# Patient Record
Sex: Female | Born: 1980 | Race: White | Hispanic: No | Marital: Single | Smoking: Former smoker
Health system: Southern US, Community
[De-identification: ages and names within clinical notes are randomized; demographics above are authoritative.]

## PROBLEM LIST (undated history)

## (undated) DIAGNOSIS — Z8619 Personal history of other infectious and parasitic diseases: Secondary | ICD-10-CM

## (undated) HISTORY — PX: TUBAL LIGATION: SHX77

## (undated) HISTORY — PX: AUGMENTATION MAMMAPLASTY: SUR837

## (undated) HISTORY — PX: IUD REMOVAL: SHX5392

---
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
## 2005-11-28 ENCOUNTER — Emergency Department: Payer: Self-pay | Admitting: Unknown Physician Specialty

## 2005-12-05 ENCOUNTER — Ambulatory Visit: Payer: Self-pay | Admitting: Family Medicine

## 2006-03-02 ENCOUNTER — Emergency Department: Payer: Self-pay | Admitting: Emergency Medicine

## 2007-07-12 ENCOUNTER — Observation Stay: Payer: Self-pay

## 2007-07-21 ENCOUNTER — Inpatient Hospital Stay: Payer: Self-pay | Admitting: Internal Medicine

## 2007-07-21 ENCOUNTER — Other Ambulatory Visit: Payer: Self-pay

## 2007-08-08 ENCOUNTER — Emergency Department: Payer: Self-pay | Admitting: Unknown Physician Specialty

## 2007-08-19 ENCOUNTER — Inpatient Hospital Stay: Payer: Self-pay | Admitting: Obstetrics and Gynecology

## 2008-08-06 IMAGING — CR DG CHEST 2V
1 series · 2 of 2 positions shown · non-contrast
Comparison: none

REASON FOR EXAM: follow up possible pneumonia and lymphadenopathy
COMMENTS:

[Series 1: view not recorded · 0.17mm/px · 2 of 2 slices shown]
[im 1/2]
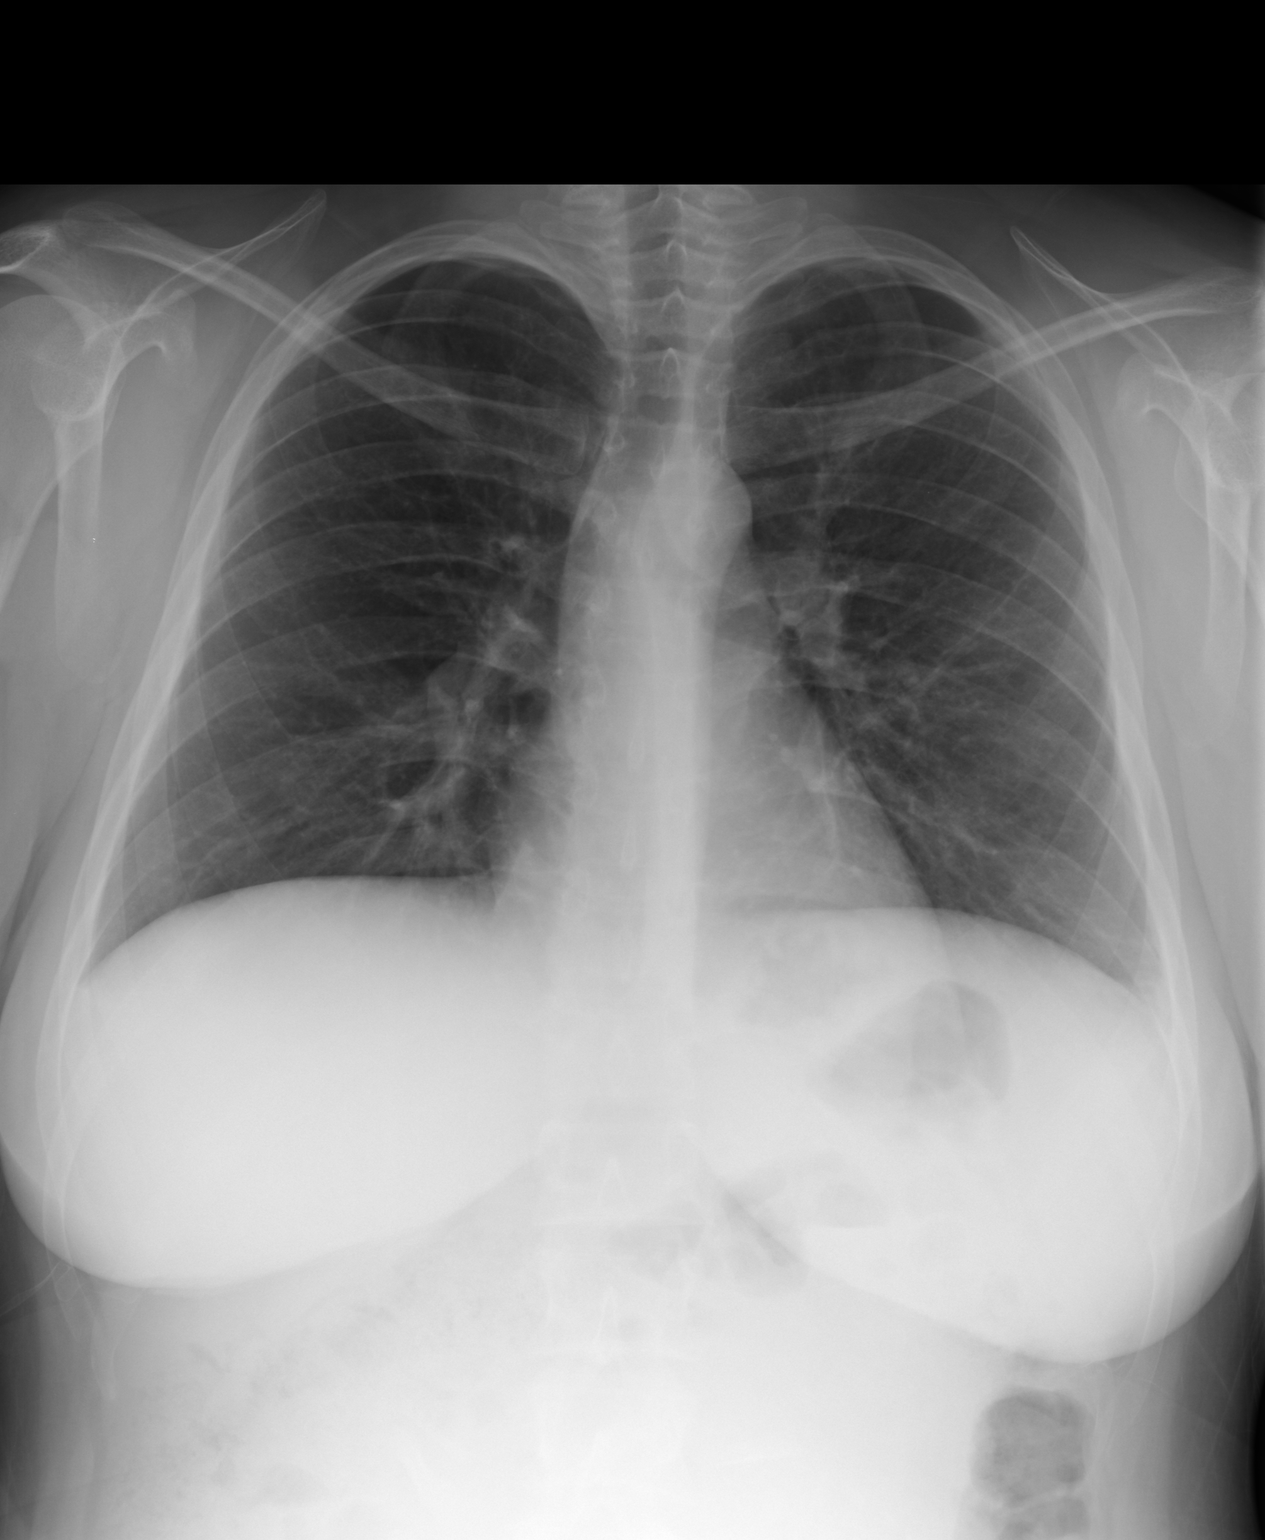
[im 2/2]
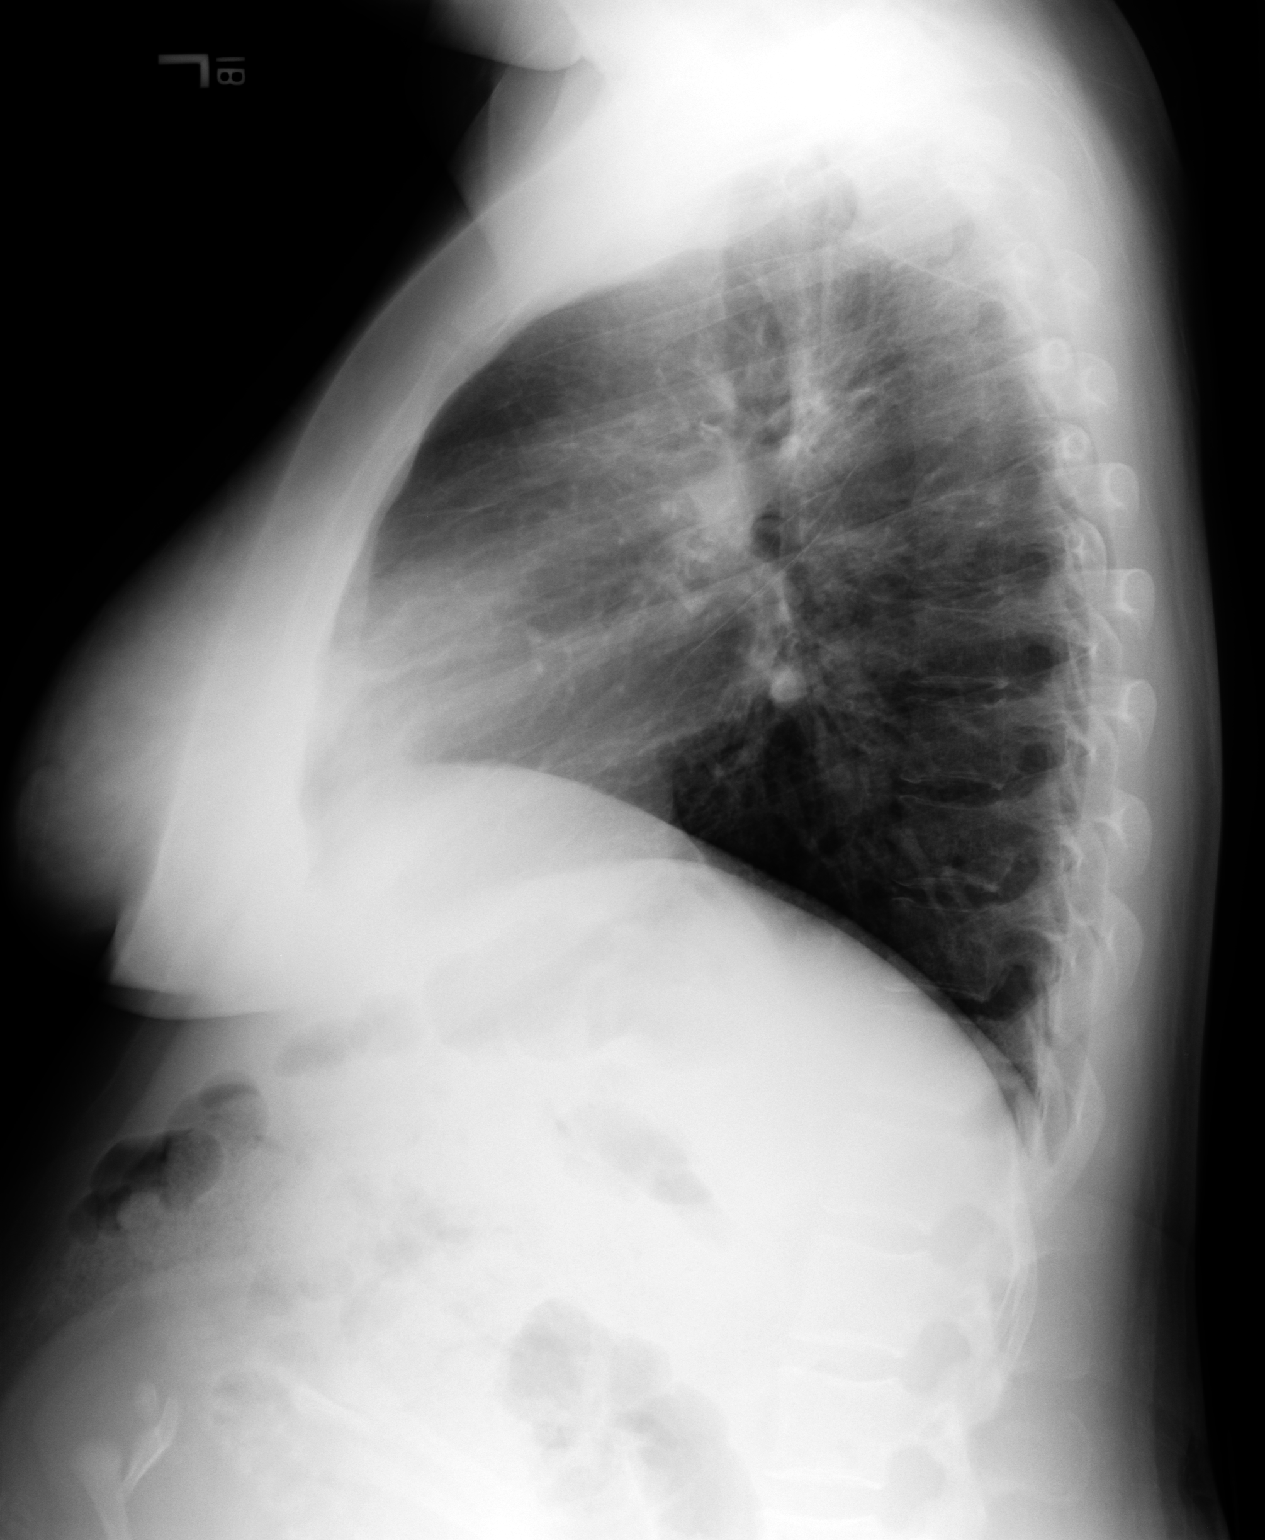

[2 of 2 positions shown; findings below may reference images not displayed]

PROCEDURE:     DXR - DXR CHEST PA (OR AP) AND LATERAL  - July 24, 2007  [DATE]

RESULT:     Comparison is made to the image of 07/21/2007. There is improved
aeration in the RIGHT infrahilar region with persistent mild prominence of
the lung markings. No focal consolidation is seen. There is no effusion or
pneumothorax. The heart is not enlarged. There is no pleural effusion.
IMPRESSION: Some residual underlying bronchitis or bronchiolitis cannot
be completely excluded. There is no progression; in fact, there may be
improved aeration at the RIGHT lung base.

## 2009-04-11 ENCOUNTER — Emergency Department: Payer: Self-pay | Admitting: Emergency Medicine

## 2009-11-21 ENCOUNTER — Emergency Department: Payer: Self-pay | Admitting: Emergency Medicine

## 2010-02-23 ENCOUNTER — Ambulatory Visit: Payer: Self-pay

## 2010-03-02 ENCOUNTER — Ambulatory Visit: Payer: Self-pay

## 2011-08-25 ENCOUNTER — Observation Stay: Payer: Self-pay | Admitting: Obstetrics & Gynecology

## 2011-08-28 LAB — PATHOLOGY REPORT

## 2014-10-12 ENCOUNTER — Emergency Department: Payer: Self-pay | Admitting: Emergency Medicine

## 2014-12-06 ENCOUNTER — Emergency Department: Payer: Self-pay | Admitting: Emergency Medicine

## 2016-08-14 ENCOUNTER — Encounter: Payer: Self-pay | Admitting: Emergency Medicine

## 2016-08-14 ENCOUNTER — Emergency Department
Admission: EM | Admit: 2016-08-14 | Discharge: 2016-08-14 | Disposition: A | Discharge status: Home / Self Care | Payer: Managed Care, Other (non HMO) | Attending: Emergency Medicine | Admitting: Emergency Medicine

## 2016-08-14 DIAGNOSIS — R112 Nausea with vomiting, unspecified: Secondary | ICD-10-CM | POA: Diagnosis not present

## 2016-08-14 DIAGNOSIS — R197 Diarrhea, unspecified: Secondary | ICD-10-CM | POA: Diagnosis not present

## 2016-08-14 DIAGNOSIS — R1032 Left lower quadrant pain: Secondary | ICD-10-CM

## 2016-08-14 DIAGNOSIS — J069 Acute upper respiratory infection, unspecified: Secondary | ICD-10-CM | POA: Diagnosis not present

## 2016-08-14 LAB — CBC
HEMATOCRIT: 41.2 % (ref 35.0–47.0)
Hemoglobin: 14.6 g/dL (ref 12.0–16.0)
MCH: 33.2 pg (ref 26.0–34.0)
MCHC: 35.5 g/dL (ref 32.0–36.0)
MCV: 93.5 fL (ref 80.0–100.0)
PLATELETS: 164 10*3/uL (ref 150–440)
RBC: 4.41 MIL/uL (ref 3.80–5.20)
RDW: 13.1 % (ref 11.5–14.5)
WBC: 6.1 10*3/uL (ref 3.6–11.0)

## 2016-08-14 LAB — COMPREHENSIVE METABOLIC PANEL
ALT: 13 U/L — AB (ref 14–54)
AST: 16 U/L (ref 15–41)
Albumin: 4.4 g/dL (ref 3.5–5.0)
Alkaline Phosphatase: 49 U/L (ref 38–126)
Anion gap: 6 (ref 5–15)
BUN: 8 mg/dL (ref 6–20)
CHLORIDE: 105 mmol/L (ref 101–111)
CO2: 26 mmol/L (ref 22–32)
CREATININE: 0.58 mg/dL (ref 0.44–1.00)
Calcium: 9.3 mg/dL (ref 8.9–10.3)
Glucose, Bld: 105 mg/dL — ABNORMAL HIGH (ref 65–99)
POTASSIUM: 3.6 mmol/L (ref 3.5–5.1)
SODIUM: 137 mmol/L (ref 135–145)
Total Bilirubin: 0.9 mg/dL (ref 0.3–1.2)
Total Protein: 7.7 g/dL (ref 6.5–8.1)

## 2016-08-14 LAB — URINALYSIS COMPLETE WITH MICROSCOPIC (ARMC ONLY)
BACTERIA UA: NONE SEEN
Bilirubin Urine: NEGATIVE
Glucose, UA: NEGATIVE mg/dL
HGB URINE DIPSTICK: NEGATIVE
Ketones, ur: NEGATIVE mg/dL
Nitrite: NEGATIVE
PH: 6 (ref 5.0–8.0)
PROTEIN: NEGATIVE mg/dL
SPECIFIC GRAVITY, URINE: 1.006 (ref 1.005–1.030)

## 2016-08-14 LAB — PREGNANCY, URINE: PREG TEST UR: NEGATIVE

## 2016-08-14 LAB — LIPASE, BLOOD: LIPASE: 21 U/L (ref 11–51)

## 2016-08-14 MED ORDER — NITROFURANTOIN MONOHYD MACRO 100 MG PO CAPS
100.0000 mg | ORAL_CAPSULE | Freq: Once | ORAL | Status: AC
  Administered 2016-08-14: 100 mg via ORAL
  Filled 2016-08-14: qty 1

## 2016-08-14 MED ORDER — ONDANSETRON HCL 4 MG PO TABS
4.0000 mg | ORAL_TABLET | Freq: Once | ORAL | Status: AC
  Administered 2016-08-14: 4 mg via ORAL
  Filled 2016-08-14 (×2): qty 1

## 2016-08-14 MED ORDER — ONDANSETRON HCL 4 MG PO TABS: 4.0000 mg | ORAL_TABLET | Freq: Every day | ORAL | 0 refills | Status: DC | PRN

## 2016-08-14 MED ORDER — NITROFURANTOIN MONOHYD MACRO 100 MG PO CAPS
ORAL_CAPSULE | ORAL | Status: AC
  Administered 2016-08-14: 100 mg via ORAL
  Filled 2016-08-14: qty 1

## 2016-08-14 MED ORDER — DICYCLOMINE HCL 10 MG PO CAPS
10.0000 mg | ORAL_CAPSULE | Freq: Once | ORAL | Status: AC
  Administered 2016-08-14: 10 mg via ORAL
  Filled 2016-08-14 (×2): qty 1

## 2016-08-14 MED ORDER — DICYCLOMINE HCL 20 MG PO TABS: 20.0000 mg | ORAL_TABLET | Freq: Three times a day (TID) | ORAL | 0 refills | Status: DC | PRN

## 2016-08-14 MED ORDER — NITROFURANTOIN MONOHYD MACRO 100 MG PO CAPS: 100.0000 mg | ORAL_CAPSULE | Freq: Two times a day (BID) | ORAL | 0 refills | Status: AC

## 2016-08-14 NOTE — ED Triage Notes (Signed)
Patient ambulatory to triage with steady gait, without difficulty or distress noted; pt reports since last Tuesday having N/V, chills, and lower abd pain

## 2016-08-14 NOTE — ED Provider Notes (Signed)
[facility] Emergency Department Provider Note  ____________________________________________   First MD Initiated Contact with Patient 08/14/16 2112     (approximate)  I have reviewed the triage vital signs and the nursing notes.   HISTORY  Chief Complaint Abdominal Pain   HPI Karen Lowery is a 35 y.o. female without any chronic medical problems was presenting to the emergency department todaywith 1 day of lower abdominal cramping which she describes as a 5 out of 10. She says that she has felt sick since last week when she had a runny nose, cough and sore throat. She says the sore throat is been resolving. She denies any known sick contacts. Says since yesterday she has had nausea, vomiting and diarrhea. Says she has vomited about 5 times and had 2 episodes of diarrhea. Denies any blood in her vomitus or her stool. She says that she is most concerned about her nausea and vomiting. Says that her abdominal pain is around the bellybutton. Denies any radiation.   History reviewed. No pertinent past medical history.  There are no active problems to display for this patient.   History reviewed. No pertinent surgical history.  Prior to Admission medications   Not on File    Allergies Patient has no known allergies.  No family history on file.  Social History Social History  Substance Use Topics  . Smoking status: Never Smoker  . Smokeless tobacco: Never Used  . Alcohol use No    Review of Systems Constitutional: No fever/chills Eyes: No visual changes. ENT: No sore throat. Cardiovascular: Denies chest pain. Respiratory: Denies shortness of breath. Gastrointestinal: No constipation. Genitourinary: Negative for dysuria. Musculoskeletal: Negative for back pain. Skin: Negative for rash. Neurological: Negative for headaches, focal weakness or numbness.  10-point ROS otherwise  negative.  ____________________________________________   PHYSICAL EXAM:  VITAL SIGNS: ED Triage Vitals [08/14/16 2009]  Enc Vitals Group     BP 128/86     Pulse Rate 98     Resp 18     Temp 98.4 F (36.9 C)     Temp Source Oral     SpO2 100 %     Weight 130 lb (59 kg)     Height 5\' 2"  (1.575 m)     Head Circumference      Peak Flow      Pain Score 5     Pain Loc      Pain Edu?      Excl. in [place]?     Constitutional: Alert and oriented. Well appearing and in no acute distress. Eyes: Conjunctivae are normal. PERRL. EOMI. Head: Atraumatic. Nose: No congestion/rhinnorhea. Mouth/Throat: Mucous membranes are moist.   Neck: No stridor.   Cardiovascular: Normal rate, regular rhythm. Grossly normal heart sounds.   Respiratory: Normal respiratory effort.  No retractions. Lungs CTAB. Gastrointestinal: Soft With mild left lower quadrant tenderness. No distention. No CVA tenderness palpation. Musculoskeletal: No lower extremity tenderness nor edema.  No joint effusions. Neurologic:  Normal speech and language. No gross focal neurologic deficits are appreciated.  Skin:  Skin is warm, dry and intact. No rash noted. Psychiatric: Mood and affect are normal. Speech and behavior are normal.  ____________________________________________   LABS (all labs ordered are listed, but only abnormal results are displayed)  Labs Reviewed  COMPREHENSIVE METABOLIC PANEL - Abnormal; Notable for the following:       Result Value   Glucose, Bld 105 (*)    ALT 13 (*)  All other components within normal limits  URINALYSIS COMPLETEWITH MICROSCOPIC ([facility] ONLY) - Abnormal; Notable for the following:    Color, Urine YELLOW (*)    APPearance CLEAR (*)    Leukocytes, UA 3+ (*)    Squamous Epithelial / LPF 0-5 (*)    All other components within normal limits  URINE CULTURE  LIPASE, BLOOD  CBC  PREGNANCY, URINE    ____________________________________________  EKG   ____________________________________________  RADIOLOGY   ____________________________________________   PROCEDURES  Procedure(s) performed:   Procedures  Critical Care performed:   ____________________________________________   INITIAL IMPRESSION / ASSESSMENT AND PLAN / ED COURSE  Pertinent labs & imaging results that were available during my care of the patient were reviewed by me and considered in my medical decision making (see chart for details).  Clinical Course   ----------------------------------------- 10:45 PM on 08/14/2016 -----------------------------------------  After medication the patient is able to take sips of water. Abdominal exam is unlikely be an acute process requiring hospitalization or surgical intervention at this time. Her story is consistent with a viral syndrome. She has very reassuring labs except for UTI which we will be treating. She'll be discharged home on Zofran as well as Bentyl. Additionally, she'll be given antibiotics for the UTI. She is to return for any worsening or concerning symptoms.   ____________________________________________   FINAL CLINICAL IMPRESSION(S) / ED DIAGNOSES  URI. Abdominal pain. Nausea vomiting and diarrhea.    NEW MEDICATIONS STARTED DURING THIS VISIT:  New Prescriptions   No medications on file     Note:  This document was prepared using Dragon voice recognition software and may include unintentional dictation errors.    Orbie Pyo, MD 08/14/16 [phone]

## 2016-08-14 NOTE — ED Notes (Signed)
Pt. Reports cold sx x8 days, N/V/D x1 day. No OTC meds at home

## 2016-08-14 NOTE — ED Notes (Signed)
Pt unable to urinate at this moment, given specimen cup for when is able to void.  

## 2016-08-14 NOTE — ED Notes (Signed)

## 2016-08-16 LAB — URINE CULTURE: Culture: <10000 — AB

## 2016-08-19 ENCOUNTER — Emergency Department
Admission: EM | Admit: 2016-08-19 | Discharge: 2016-08-19 | Disposition: A | Discharge status: Home / Self Care | Payer: Managed Care, Other (non HMO) | Attending: Emergency Medicine | Admitting: Emergency Medicine

## 2016-08-19 ENCOUNTER — Emergency Department: Payer: Managed Care, Other (non HMO)

## 2016-08-19 ENCOUNTER — Encounter: Payer: Self-pay | Admitting: Emergency Medicine

## 2016-08-19 DIAGNOSIS — R109 Unspecified abdominal pain: Secondary | ICD-10-CM

## 2016-08-19 DIAGNOSIS — R1012 Left upper quadrant pain: Secondary | ICD-10-CM | POA: Insufficient documentation

## 2016-08-19 LAB — CBC
HCT: 39.3 % (ref 35.0–47.0)
HEMOGLOBIN: 13.9 g/dL (ref 12.0–16.0)
MCH: 33.5 pg (ref 26.0–34.0)
MCHC: 35.3 g/dL (ref 32.0–36.0)
MCV: 94.7 fL (ref 80.0–100.0)
Platelets: 164 10*3/uL (ref 150–440)
RBC: 4.16 MIL/uL (ref 3.80–5.20)
RDW: 12.8 % (ref 11.5–14.5)
WBC: 6.4 10*3/uL (ref 3.6–11.0)

## 2016-08-19 LAB — COMPREHENSIVE METABOLIC PANEL
ALK PHOS: 57 U/L (ref 38–126)
ALT: 9 U/L — ABNORMAL LOW (ref 14–54)
ANION GAP: 6 (ref 5–15)
AST: 16 U/L (ref 15–41)
Albumin: 4 g/dL (ref 3.5–5.0)
BILIRUBIN TOTAL: 0.4 mg/dL (ref 0.3–1.2)
BUN: 8 mg/dL (ref 6–20)
CALCIUM: 8.9 mg/dL (ref 8.9–10.3)
CO2: 27 mmol/L (ref 22–32)
Chloride: 105 mmol/L (ref 101–111)
Creatinine, Ser: 0.68 mg/dL (ref 0.44–1.00)
GFR calc non Af Amer: >60 mL/min (ref >60)
Glucose, Bld: 85 mg/dL (ref 65–99)
Potassium: 3.7 mmol/L (ref 3.5–5.1)
Sodium: 138 mmol/L (ref 135–145)
TOTAL PROTEIN: 7.2 g/dL (ref 6.5–8.1)

## 2016-08-19 LAB — URINALYSIS COMPLETE WITH MICROSCOPIC (ARMC ONLY)
Bacteria, UA: NONE SEEN
Bilirubin Urine: NEGATIVE
Glucose, UA: NEGATIVE mg/dL
HGB URINE DIPSTICK: NEGATIVE
Ketones, ur: NEGATIVE mg/dL
Leukocytes, UA: NEGATIVE
NITRITE: NEGATIVE
PROTEIN: NEGATIVE mg/dL
SPECIFIC GRAVITY, URINE: 1.002 — AB (ref 1.005–1.030)
pH: 8 (ref 5.0–8.0)

## 2016-08-19 LAB — POCT PREGNANCY, URINE: PREG TEST UR: NEGATIVE

## 2016-08-19 LAB — LIPASE, BLOOD: LIPASE: 17 U/L (ref 11–51)

## 2016-08-19 MED ORDER — ONDANSETRON HCL 4 MG/2ML IJ SOLN: 4.0000 mg | INTRAMUSCULAR | Status: DC

## 2016-08-19 NOTE — ED Triage Notes (Signed)
Pt presents to ED c/o abd pain since thanksgiving . Pt was recently seen here and diagnosed with bladder infection and now has pain "right underneath my ribcage"; denies n/v.

## 2016-08-19 NOTE — Discharge Instructions (Signed)
You have been seen in the Emergency Department (ED) for abdominal/flank pain.  Your evaluation did not identify a clear cause of your symptoms but was generally reassuring. ° °Please follow up as instructed above regarding today?s emergent visit and the symptoms that are bothering you. ° °Return to the ED if your abdominal pain worsens or fails to improve, you develop bloody vomiting, bloody diarrhea, you are unable to tolerate fluids due to vomiting, fever greater than 101, or other symptoms that concern you. ° °

## 2016-08-19 NOTE — ED Provider Notes (Signed)
[facility] ([place]) Emergency Department Provider Note  ____________________________________________   First MD Initiated Contact with Patient 08/19/16 1417     (approximate)  I have reviewed the triage vital signs and the nursing notes.   HISTORY  Chief Complaint Abdominal Pain and Flank Pain    HPI Karen Lowery is a 35 y.o. female with no chronic medical problems who presents for evaluation of persistent left sided abdominal/flank pain.  She states that it has been going on for between 1 and 2 weeks.  The pain is intermittent, sharp and stabbing, and located around her back up underneath the left side of the rib cage.  It is not worse with deep breaths.  It is worse with moving around.  She denies chest pain, shortness of breath, right-sided abdominal pain both upper and lower, dysuria, nausea, vomiting, diarrhea, constipation, vaginal bleeding, vaginal discharge, sore throat, nasal congestion.  (She did have some nausea a week ago, but that has resolved.)  When she was seen here about a week ago she had normal labs except for a slightly positive urinalysis and was treated for UTI.  She states that the pain is not any better.  She describes the pain as moderate and she is concerned because it is persisted for this long.  She has no outpatient doctor.  She has no history of kidney stones or of lung problems.   History reviewed. No pertinent past medical history.  There are no active problems to display for this patient.   Past Surgical History:  Procedure Laterality Date  . IUD REMOVAL      Prior to Admission medications   Medication Sig Start Date End Date Taking? Authorizing Provider  dicyclomine (BENTYL) 20 MG tablet Take 1 tablet (20 mg total) by mouth 3 (three) times daily as needed for spasms. 08/14/16 08/14/17  Orbie Pyo, MD  nitrofurantoin, macrocrystal-monohydrate, (MACROBID) 100 MG capsule Take 1 capsule (100 mg total) by mouth 2 (two)  times daily. 08/14/16 08/21/16  Orbie Pyo, MD  ondansetron (ZOFRAN) 4 MG tablet Take 1 tablet (4 mg total) by mouth daily as needed. 08/14/16   Orbie Pyo, MD    Allergies Patient has no known allergies.  History reviewed. No pertinent family history.  Social History Social History  Substance Use Topics  . Smoking status: Never Smoker  . Smokeless tobacco: Never Used  . Alcohol use No    Review of Systems Constitutional: No fever/chills Eyes: No visual changes. ENT: No sore throat. Cardiovascular: Denies chest pain. Respiratory: Denies shortness of breath. Gastrointestinal: Left sided upper posterior abdominal pain/flank pain.  No right-sided abdominal pain.  Nausea about a week ago, now resolved.  No vomiting nor diarrhea Genitourinary: Negative for dysuria. No vaginal bleeding/discharge Musculoskeletal: Negative for back pain. Skin: Negative for rash. Neurological: Negative for headaches, focal weakness or numbness.  10-point ROS otherwise negative.  ____________________________________________   PHYSICAL EXAM:  VITAL SIGNS: ED Triage Vitals  Enc Vitals Group     BP 08/19/16 1317 101/62     Pulse Rate 08/19/16 1317 71     Resp 08/19/16 1317 18     Temp 08/19/16 1317 97.8 F (36.6 C)     Temp Source 08/19/16 1317 Oral     SpO2 08/19/16 1317 100 %     Weight 08/19/16 1318 130 lb (59 kg)     Height 08/19/16 1318 5\' 3"  (1.6 m)     Head Circumference --  Peak Flow --      Pain Score 08/19/16 1318 7     Pain Loc --      Pain Edu? --      Excl. in [place]? --     Constitutional: Alert and oriented. Well appearing and in no acute distress. Eyes: Conjunctivae are normal. PERRL. EOMI. Head: Atraumatic. Nose: No congestion/rhinnorhea. Mouth/Throat: Mucous membranes are moist.  Oropharynx non-erythematous. Neck: No stridor.  No meningeal signs.   Cardiovascular: Normal rate, regular rhythm. Good peripheral circulation. Grossly normal heart  sounds. Respiratory: Normal respiratory effort.  No retractions. Lungs CTAB. Gastrointestinal: Normal body habitus.  Soft and nontender of the anterior abdomen throughout all quadrants. No distention.  Patient does have left sided CVA tenderness. Musculoskeletal: No lower extremity tenderness nor edema. No gross deformities of extremities. Neurologic:  Normal speech and language. No gross focal neurologic deficits are appreciated.  Skin:  Skin is warm, dry and intact. No rash noted. Psychiatric: Mood and affect are normal. Speech and behavior are normal.  ____________________________________________   LABS (all labs ordered are listed, but only abnormal results are displayed)  Labs Reviewed  COMPREHENSIVE METABOLIC PANEL - Abnormal; Notable for the following:       Result Value   ALT 9 (*)    All other components within normal limits  URINALYSIS COMPLETEWITH MICROSCOPIC ([facility] ONLY) - Abnormal; Notable for the following:    Color, Urine YELLOW (*)    APPearance CLEAR (*)    Specific Gravity, Urine 1.002 (*)    Squamous Epithelial / LPF 0-5 (*)    All other components within normal limits  LIPASE, BLOOD  CBC  POC URINE PREG, ED  POCT PREGNANCY, URINE   ____________________________________________  EKG  None - EKG not ordered by ED physician ____________________________________________  RADIOLOGY   Ct Renal Stone Study  Result Date: 08/19/2016 CLINICAL DATA:  Left-sided abdominal and flank pain for 1 week. Urinary tract infection. EXAM: CT ABDOMEN AND PELVIS WITHOUT CONTRAST TECHNIQUE: Multidetector CT imaging of the abdomen and pelvis was performed following the standard protocol without IV contrast. COMPARISON:  08/24/2011 FINDINGS: Lower chest: No acute findings. Hepatobiliary: No masses visualized on this unenhanced exam. Gallbladder is unremarkable. Pancreas: No mass or inflammatory process visualized on this unenhanced exam. Spleen:  Within normal limits in size.  Adrenals/Urinary tract: No evidence of urolithiasis or hydronephrosis. No evidence of perinephric stranding or fluid collections. Unremarkable appearance of bladder. Stomach/Bowel: No evidence of obstruction, inflammatory process, or abnormal fluid collections. Normal appendix visualized. Vascular/Lymphatic: No pathologically enlarged lymph nodes identified. No evidence of abdominal aortic aneurysm. Reproductive:  No mass or other significant abnormality. Other:  None. Musculoskeletal:  No suspicious bone lesions identified. IMPRESSION: No evidence of urolithiasis, hydronephrosis, or other acute findings. Electronically Signed   By: Earle Gell M.D.   On: 08/19/2016 14:50    ____________________________________________   PROCEDURES  Procedure(s) performed:   Procedures   Critical Care performed: No ____________________________________________   INITIAL IMPRESSION / ASSESSMENT AND PLAN / ED COURSE  Pertinent labs & imaging results that were available during my care of the patient were reviewed by me and considered in my medical decision making (see chart for details).  The patient's physical exam, lab results, and vital signs are all reassuring.  She has no tenderness to palpation of her abdomen and she only has reproducible tenderness when palpating and tapping on her left flank.  We had a discussion about the results and her symptoms and we decided upon a CT  renal stone protocol to look for any gross abnormalities as well as ruling out the possibility that she may have kidney stones that are intermittently causing her issues.  I explained to her that if this is normal then I suspect that her pain is likely musculoskeletal and she will need to establish outpatient provider follow-up.  She has no risk factors for ACS, she is PERC negative, has no signs or symptoms of DVT, and there is no reason to suspect a vascular abnormality to the kidney which would require CT angiography.  He agrees with  the plan at this time.   Clinical Course as of Aug 19 1516  Sun Aug 19, 2016  1516 Normal CT.  I updated the patient with the reassuring results and encouraged her to treat this as a musculoskeletal injury and to set up a PCP/outpatient follow-up as soon as possible.I gave my usual and customary return precautions. Given the duration of symptoms I encouraged her to continue with over-the-counter medications such as Tylenol and Aleve. CT Renal Laren Everts [CF]    Clinical Course User Index [CF] Hinda Kehr, MD    ____________________________________________  FINAL CLINICAL IMPRESSION(S) / ED DIAGNOSES  Final diagnoses:  Left flank pain     MEDICATIONS GIVEN DURING THIS VISIT:  Medications - No data to display   NEW OUTPATIENT MEDICATIONS STARTED DURING THIS VISIT:  New Prescriptions   No medications on file    Modified Medications   No medications on file    Discontinued Medications   No medications on file     Note:  This document was prepared using Dragon voice recognition software and may include unintentional dictation errors.    Hinda Kehr, MD 08/19/16 [phone]

## 2016-08-19 NOTE — ED Notes (Signed)
Pt back from CT. In room with lights dimmed.

## 2016-08-19 NOTE — ED Notes (Signed)
Pt alert and oriented X4, active, cooperative, pt in NAD. RR even and unlabored, color WNL.  Pt informed to return if any life threatening symptoms occur.   

## 2016-08-19 NOTE — ED Notes (Addendum)
Left sided abdominal pain X 1 week. Pt being treated for UTI, pain no better. States nausea better than it was before. Pt pushed to room in wheelchair. Pt alert and oriented X4, active, cooperative, pt in NAD. RR even and unlabored, color WNL.

## 2016-08-19 NOTE — ED Notes (Signed)
ED Provider at bedside. 

## 2016-08-19 NOTE — ED Notes (Signed)
Patient transported to CT 

## 2019-05-19 ENCOUNTER — Other Ambulatory Visit: Payer: Self-pay

## 2019-05-19 ENCOUNTER — Encounter: Payer: Self-pay | Admitting: Emergency Medicine

## 2019-05-19 DIAGNOSIS — M791 Myalgia, unspecified site: Secondary | ICD-10-CM | POA: Insufficient documentation

## 2019-05-19 DIAGNOSIS — R51 Headache: Secondary | ICD-10-CM | POA: Insufficient documentation

## 2019-05-19 DIAGNOSIS — U071 COVID-19: Secondary | ICD-10-CM | POA: Insufficient documentation

## 2019-05-19 DIAGNOSIS — R52 Pain, unspecified: Secondary | ICD-10-CM | POA: Diagnosis present

## 2019-05-19 DIAGNOSIS — Z8616 Personal history of COVID-19: Secondary | ICD-10-CM

## 2019-05-19 DIAGNOSIS — R112 Nausea with vomiting, unspecified: Secondary | ICD-10-CM | POA: Diagnosis not present

## 2019-05-19 HISTORY — DX: Personal history of COVID-19: Z86.16

## 2019-05-19 NOTE — ED Triage Notes (Signed)
Pt reports frontal HA accomp by nausea tonight; +COVID on Monday

## 2019-05-19 NOTE — ED Notes (Signed)
No answer when called several times from lobby 

## 2019-05-20 ENCOUNTER — Encounter: Payer: Self-pay | Admitting: Emergency Medicine

## 2019-05-20 ENCOUNTER — Emergency Department
Admission: EM | Admit: 2019-05-20 | Discharge: 2019-05-20 | Disposition: A | Discharge status: Home / Self Care | Payer: Managed Care, Other (non HMO) | Attending: Emergency Medicine | Admitting: Emergency Medicine

## 2019-05-20 DIAGNOSIS — R112 Nausea with vomiting, unspecified: Secondary | ICD-10-CM

## 2019-05-20 DIAGNOSIS — U071 COVID-19: Secondary | ICD-10-CM

## 2019-05-20 DIAGNOSIS — R519 Headache, unspecified: Secondary | ICD-10-CM

## 2019-05-20 DIAGNOSIS — M791 Myalgia, unspecified site: Secondary | ICD-10-CM

## 2019-05-20 LAB — CBC WITH DIFFERENTIAL/PLATELET
Abs Immature Granulocytes: 0 10*3/uL (ref 0.00–0.07)
Basophils Absolute: 0 10*3/uL (ref 0.0–0.1)
Basophils Relative: 0 %
Eosinophils Absolute: 0 10*3/uL (ref 0.0–0.5)
Eosinophils Relative: 0 %
HCT: 52.2 % — ABNORMAL HIGH (ref 36.0–46.0)
Hemoglobin: 17.8 g/dL — ABNORMAL HIGH (ref 12.0–15.0)
Immature Granulocytes: 0 %
Lymphocytes Relative: 48 %
Lymphs Abs: 1.3 10*3/uL (ref 0.7–4.0)
MCH: 31 pg (ref 26.0–34.0)
MCHC: 34.1 g/dL (ref 30.0–36.0)
MCV: 90.8 fL (ref 80.0–100.0)
Monocytes Absolute: 0.2 10*3/uL (ref 0.1–1.0)
Monocytes Relative: 7 %
Neutro Abs: 1.3 10*3/uL — ABNORMAL LOW (ref 1.7–7.7)
Neutrophils Relative %: 45 %
Platelets: 75 10*3/uL — ABNORMAL LOW (ref 150–400)
RBC: 5.75 MIL/uL — ABNORMAL HIGH (ref 3.87–5.11)
RDW: 12.1 % (ref 11.5–15.5)
WBC: 2.8 10*3/uL — ABNORMAL LOW (ref 4.0–10.5)
nRBC: 0 % (ref 0.0–0.2)

## 2019-05-20 LAB — BASIC METABOLIC PANEL
Anion gap: 9 (ref 5–15)
BUN: 12 mg/dL (ref 6–20)
CO2: 23 mmol/L (ref 22–32)
Calcium: 8.5 mg/dL — ABNORMAL LOW (ref 8.9–10.3)
Chloride: 106 mmol/L (ref 98–111)
Creatinine, Ser: 0.53 mg/dL (ref 0.44–1.00)
GFR calc Af Amer: >60 mL/min (ref >60)
GFR calc non Af Amer: >60 mL/min (ref >60)
Glucose, Bld: 88 mg/dL (ref 70–99)
Potassium: 3.9 mmol/L (ref 3.5–5.1)
Sodium: 138 mmol/L (ref 135–145)

## 2019-05-20 MED ORDER — SODIUM CHLORIDE 0.9 % IV BOLUS
1000.0000 mL | Freq: Once | INTRAVENOUS | Status: AC
  Administered 2019-05-20: 1000 mL via INTRAVENOUS

## 2019-05-20 MED ORDER — METOCLOPRAMIDE HCL 5 MG/ML IJ SOLN
10.0000 mg | INTRAMUSCULAR | Status: AC
  Administered 2019-05-20: 10 mg via INTRAVENOUS
  Filled 2019-05-20: qty 2

## 2019-05-20 MED ORDER — KETOROLAC TROMETHAMINE 30 MG/ML IJ SOLN
15.0000 mg | Freq: Once | INTRAMUSCULAR | Status: AC
  Administered 2019-05-20: 15 mg via INTRAVENOUS
  Filled 2019-05-20: qty 1

## 2019-05-20 MED ORDER — DIPHENHYDRAMINE HCL 50 MG/ML IJ SOLN
25.0000 mg | INTRAMUSCULAR | Status: AC
  Administered 2019-05-20: 25 mg via INTRAVENOUS
  Filled 2019-05-20: qty 1

## 2019-05-20 MED ORDER — PROMETHAZINE HCL 25 MG RE SUPP: 25.0000 mg | Freq: Four times a day (QID) | RECTAL | 1 refills | Status: DC | PRN

## 2019-05-20 NOTE — Discharge Instructions (Signed)
As we discussed, we believe your symptoms are secondary to COVID-19.  It is very important that you stay hydrated.  You can take over-the-counter Tylenol, as much as 1000 mg every 6 hours.  There is also no evidence anymore that ibuprofen causes any complications so you can take 600 mg of ibuprofen 3 times a day with food if you would like to try it as well.  Use the nausea medicine you were previously prescribed as needed or try the suppositories prescribed tonight but do not use both at the same time.  You need to give your body a few weeks to recover and it is important that you only go to the doctor's office or return to the emergency department if you develop worsening symptoms such as difficulty breathing or complete inability to tolerate anything by mouth.  Please use your own judgment and read through the included information about COVID-19, preventing the spread of the disease, etc.  Also as we discussed, all of your family members have been exposed and do not require testing, you should just assume that they have it.  They can follow-up with your doctor or with an outpatient testing center if needed but there is no indication that they need to be tested immediately.

## 2019-05-20 NOTE — ED Provider Notes (Signed)
[facility] Emergency Department Provider Note  ____________________________________________   First MD Initiated Contact with Patient 05/20/19 [phone]     (approximate)  I have reviewed the triage vital signs and the nursing notes.   HISTORY  Chief Complaint Headache    HPI SPENSER Lowery is a 38 y.o. female with no contributory past medical history who presents for evaluation of headache, nausea, vomiting, and body aches.  She has had the symptoms for a few days.  She had outpatient COVID-19 testing performed at the [facility] outpatient testing center and received her positive results yesterday.  Her headache gradually got worse over the course of the day and she has had persistent nausea and vomiting.  She has a loss of smell and taste and says that because of the loss of taste she does not want to eat or drink very much so she feels dehydrated.  She was given a prescription for nausea medicine (Zofran ODT) but she says it is not helping.  She has no pain except for a frontal throbbing severe headache.  She said the light makes the headache worse.  She has no fever or chills, cough, sore throat, shortness of breath, chest pain, abdominal pain, or dysuria.  Her muscles hurt all throughout her body and have been doing so for a few days.  Nothing particular seems to make the symptoms better.  She describes them as severe.  She has several family members who live at home that are asymptomatic.         Past Medical History:  Diagnosis Date   History of 2019 novel coronavirus disease (COVID-19) 05/2019   Tested + on 05/19/2019    There are no active problems to display for this patient.   Past Surgical History:  Procedure Laterality Date   IUD REMOVAL      Prior to Admission medications   Medication Sig Start Date End Date Taking? Authorizing Provider  dicyclomine (BENTYL) 20 MG tablet Take 1 tablet (20 mg total) by mouth 3 (three) times daily as needed for  spasms. 08/14/16 08/14/17  Orbie Pyo, MD  ondansetron (ZOFRAN) 4 MG tablet Take 1 tablet (4 mg total) by mouth daily as needed. 08/14/16   Schaevitz, Randall An, MD  promethazine (PHENERGAN) 25 MG suppository Place 1 suppository (25 mg total) rectally every 6 (six) hours as needed for nausea. 05/20/19 05/19/20  Hinda Kehr, MD    Allergies Patient has no known allergies.  History reviewed. No pertinent family history.  Social History Social History   Tobacco Use   Smoking status: Never Smoker   Smokeless tobacco: Never Used  Substance Use Topics   Alcohol use: No   Drug use: Not on file    Review of Systems Constitutional: No fever/chills.  Myalgias and general malaise. Eyes: No visual changes. ENT: Loss of smell and taste.  No sore throat. Cardiovascular: Denies chest pain. Respiratory: Denies shortness of breath and cough. Gastrointestinal: Persistent nausea and vomiting.  No diarrhea.  No abdominal pain. Genitourinary: Negative for dysuria. Musculoskeletal: General myalgias.  Negative for neck pain.  Negative for back pain. Integumentary: Negative for rash. Neurological: Frontal throbbing headache.  No focal numbness nor weakness.   ____________________________________________   PHYSICAL EXAM:  VITAL SIGNS: ED Triage Vitals  Enc Vitals Group     BP 05/19/19 2213 119/71     Pulse Rate 05/19/19 2213 92     Resp 05/19/19 2213 20     Temp 05/19/19 2213 98.3  F (36.8 C)     Temp Source 05/19/19 2213 Oral     SpO2 05/19/19 2213 98 %     Weight 05/19/19 2214 64.9 kg (143 lb)     Height 05/19/19 2214 1.6 m (5\' 3" )     Head Circumference --      Peak Flow --      Pain Score 05/19/19 2214 9     Pain Loc --      Pain Edu? --      Excl. in [place]? --     Constitutional: Alert and oriented.  Appears uncomfortable but is not in acute distress and is nontoxic in appearance. Head: Atraumatic. Nose: No congestion/rhinnorhea. Neck: No stridor.  No  meningeal signs.   Cardiovascular: Normal rate, regular rhythm. Good peripheral circulation. Grossly normal heart sounds. Respiratory: Normal respiratory effort.  No retractions.  No cough observed during history. Gastrointestinal: Soft and nontender. No distention.  Genitourinary: Deferred Musculoskeletal: No lower extremity tenderness nor edema. No gross deformities of extremities. Neurologic:  Normal speech and language. No gross focal neurologic deficits are appreciated.  Skin:  Skin is warm, dry and intact. Psychiatric: Mood and affect are normal. Speech and behavior are normal.  ____________________________________________   LABS (all labs ordered are listed, but only abnormal results are displayed)  Labs Reviewed  CBC WITH DIFFERENTIAL/PLATELET - Abnormal; Notable for the following components:      Result Value   WBC 2.8 (*)    RBC 5.75 (*)    Hemoglobin 17.8 (*)    HCT 52.2 (*)    Platelets 75 (*)    All other components within normal limits  BASIC METABOLIC PANEL - Abnormal; Notable for the following components:   Calcium 8.5 (*)    All other components within normal limits   ____________________________________________  EKG  None - EKG not ordered by ED physician ____________________________________________  RADIOLOGY I, Hinda Kehr, personally viewed and evaluated these images (plain radiographs) as part of my medical decision making, as well as reviewing the written report by the radiologist.  ED MD interpretation: No indication for emergent imaging  Official radiology report(s): No results found.  ____________________________________________   PROCEDURES   Procedure(s) performed (including Critical Care):  Procedures   ____________________________________________   INITIAL IMPRESSION / MDM / [place] / ED COURSE  As part of my medical decision making, I reviewed the following data within the [facility] notes  reviewed and incorporated, Labs reviewed , Old chart reviewed and Notes from prior ED visits   Differential diagnosis includes, but is not limited to, complications from [id number], any possible cause of headache such as migraine cluster headache tension headache or less likely tumor, SBO/ileus, other acute viral infection.  Given the patient's recent diagnosis of COVID-19 is almost guarantee that the constellations of symptoms she is experiencing now are result of novel coronavirus.  I had an extended conversation with her about what to expect over the next few weeks, indications to return to the emergency department versus treat herself at home, etc.  She feels very poorly tonight and believes that she is dehydrated because of her persistent nausea and vomiting as well as having a throbbing headache.  After discussing various management options, I agreed to give her a liter of fluids, check some basic labs, and treat her headache and nausea with the following commendation: Toradol 15 mg IV, Reglan 10 mg IV, Benadryl 25 mg IV.  I explained to her that she will  need to stay hydrated at home and return to the emergency department if she develops new or worsening symptoms.  She states that she understands and agrees.  Anticipate outpatient management and I had my usual customary COVID-19 precautions and isolation discussion.      Clinical Course as of May 20 507  Wed May 20, 2019  0423 Normal basic metabolic panel  Basic metabolic panel(!) [CF]  99991111 Pulse Rate: 69 [CF]  0430 SpO2: 100 % [CF]  0508 Leukopenia and hemoconcentration consistent with some mild dehydration.  Patient has been stable throughout her stay in the emergency department.  I will discharge as planned previously.  CBC with Differential/Platelet(!) [CF]    Clinical Course User Index [CF] Hinda Kehr, MD     ____________________________________________  FINAL CLINICAL IMPRESSION(S) / ED DIAGNOSES  Final diagnoses:  [id number]  virus infection  Non-intractable vomiting with nausea, unspecified vomiting type  Acute nonintractable headache, unspecified headache type  Myalgia     MEDICATIONS GIVEN DURING THIS VISIT:  Medications  sodium chloride 0.9 % bolus 1,000 mL (1,000 mLs Intravenous New Bag/Given 05/20/19 0324)  metoCLOPramide (REGLAN) injection 10 mg (10 mg Intravenous Given 05/20/19 0324)  diphenhydrAMINE (BENADRYL) injection 25 mg (25 mg Intravenous Given 05/20/19 0325)  ketorolac (TORADOL) 30 MG/ML injection 15 mg (15 mg Intravenous Given 05/20/19 0325)     ED Discharge Orders         Ordered    promethazine (PHENERGAN) 25 MG suppository  Every 6 hours PRN     05/20/19 0323          *Please note:  Karen Lowery was evaluated in Emergency Department on 05/20/2019 for the symptoms described in the history of present illness. She was evaluated in the context of the global COVID-19 pandemic, which necessitated consideration that the patient might be at risk for infection with the SARS-CoV-2 virus that causes COVID-19. Institutional protocols and algorithms that pertain to the evaluation of patients at risk for COVID-19 are in a state of rapid change based on information released by regulatory bodies including the CDC and federal and state organizations. These policies and algorithms were followed during the patient's care in the ED.  Some ED evaluations and interventions may be delayed as a result of limited staffing during the pandemic.*  Note:  This document was prepared using Dragon voice recognition software and may include unintentional dictation errors.   Hinda Kehr, MD 05/20/19 [phone]

## 2019-05-20 NOTE — ED Notes (Signed)
MD in to see pt.

## 2020-01-26 ENCOUNTER — Other Ambulatory Visit: Payer: Self-pay | Admitting: Obstetrics and Gynecology

## 2020-02-03 NOTE — H&P (Signed)
Chief Complaint:   Karen Lowery is a 39 y.o. female here for Pre-op Exam  History of Present Illness: Patient presents for a preoperative visit to schedule a Novasure endometrial ablation with hysteroscopy; patient has already had a BTL in 2013. Indications for procedure are menorrhagia. Patient does have uterine fibroids. She is aware that an ablation may not control the heavy bleeding due to the fibroids and that it is unlikely to last until menopause. She would like to try the Novasure and is interested in hysterectomy if it fails, as she declines hormonal options.  Work up 01/11/2020: EMBx: Proliferative phase endometrium, negative for hyperplasia and/or malignancy   TVUS with SIS: Uterus retroverted = 8.67 x 5.68 x 7.34cm Endometrium=3.66mm Fibroids seen: 1)Rt lateral=3.1cm    2)posterior=3.8cm No free fluid seen RO  appears wnl LO contains 2 cysts: 1)simple=1.4cm  2)simple with cumulus oophorus=1.7cm  Last pap: 12/2016 neg/neg  Pertinent Hx: -S/p BTL in 2013 -Vaginal deliveries x2 -Hx of IUD migrating into abdomen requiring surgery; pt got pregnant  Body mass index is 24.8 kg/m.  Past Medical History:  has a past medical history of Menorrhagia.  Past Surgical History:  has a past surgical history that includes Exploratory laparotomy (2012); Laparoscopic tubal ligation (2013); breast implants (2016); and Tubal ligation. Family History: family history includes No Known Problems in her brother, brother, brother, brother, father, and mother. Social History:  reports that she has never smoked. She has never used smokeless tobacco. She reports current alcohol use. She reports that she does not use drugs. OB/GYN History:  OB History    Gravida  3   Para  2   Term  2   Preterm      AB  1   Living  2     SAB      TAB  1   Ectopic      Molar      Multiple      Live Births           Allergies: has No Known Allergies. Medications:  Current Outpatient  Medications:  .  valACYclovir (VALTREX) 1000 MG tablet, Take 1,000 mg by mouth 2 (two) times daily as needed, Disp: , Rfl:    Exam:   BP 132/78   Ht 160 cm (5\' 3" )   Wt 63.5 kg (140 lb)   LMP 01/04/2020 (Exact Date)   BMI 24.80 kg/m   General: Patient is well-groomed, well-nourished, appears stated age in no acute distress  HEENT: head is atraumatic and normocephalic, trachea is midline, neck is supple with no palpable nodules  CV: Regular rhythm and normal heart rate, no murmur  Pulm: Clear to auscultation throughout lung fields with no wheezing, crackles, or rhonchi. No increased work of breathing  Pelvic:  Deferred  Impression:   The primary encounter diagnosis was Pre-operative clearance. Diagnoses of Excessive or frequent menstruation and Uterine leiomyoma, unspecified location were also pertinent to this visit.  Plan:   1. Preoperative visit: Novasure Hysteroscopy. Consents signed today  -Risks of surgery were discussed with the patient including but not limited to: bleeding which may require transfusion; infection which may require antibiotics; injury to uterus or surrounding organs; intrauterine scarring which may impair future fertility; need for additional procedures including laparotomy or laparoscopy; and other postoperative/anesthesia complications. Written informed consent was obtained.  Patient verbalizes understanding.  Consent form signed.  Preoperative and postoperative instructions provided. Written and verbal education provided.  No barriers to learning.   Diagnoses and all  orders for this visit:  Pre-operative clearance  Excessive or frequent menstruation  Uterine leiomyoma, unspecified location   Return for Postop check.  ~~~~~~~~~~~~~~~~~~~~~~~~~~~~~~~~~~~~~~~~~~~~~~~~~~~~~~~~~~~~ This note is partially written by Priscella Mann, in the presence of and acting as the scribe of Dr. Benjaman Kindler, who has reviewed, edited and added to the note  to reflect her best personal medical judgment.  This note was generated in part with voice recognition software and I apologize for any typographical errors that were not detected and corrected.  Sherrie George, MD

## 2020-02-05 ENCOUNTER — Encounter
Admission: RE | Admit: 2020-02-05 | Discharge: 2020-02-05 | Disposition: A | Discharge status: Home / Self Care | Payer: 59 | Source: Ambulatory Visit | Attending: Obstetrics and Gynecology | Admitting: Obstetrics and Gynecology

## 2020-02-05 ENCOUNTER — Other Ambulatory Visit: Payer: Self-pay

## 2020-02-05 NOTE — Patient Instructions (Signed)
Your procedure is scheduled on: 02/19/20 Report to [facility]. To find out your arrival time please call [phone] between 1PM - 3PM on 02/18/20.  Remember: Instructions that are not followed completely may result in serious medical risk, up to and including death, or upon the discretion of your surgeon and anesthesiologist your surgery may need to be rescheduled.     _X__ 1. Do not eat food after midnight the night before your procedure.                 No gum chewing or hard candies. You may drink clear liquids up to 2 hours                 before you are scheduled to arrive for your surgery- DO not drink clear                 liquids within 2 hours of the start of your surgery.                 Clear Liquids include:  water, apple juice without pulp, clear carbohydrate                 drink such as Clearfast or Gatorade, Black Coffee or Tea (Do not add                 anything to coffee or tea). Diabetics water only  __X__2.  On the morning of surgery brush your teeth with toothpaste and water, you                 may rinse your mouth with mouthwash if you wish.  Do not swallow any              toothpaste of mouthwash.     _X__ 3.  No Alcohol for 24 hours before or after surgery.   _X__ 4.  Do Not Smoke or use e-cigarettes For 24 Hours Prior to Your Surgery.                 Do not use any chewable tobacco products for at least 6 hours prior to                 surgery.  ____  5.  Bring all medications with you on the day of surgery if instructed.   __X__  6.  Notify your doctor if there is any change in your medical condition      (cold, fever, infections).     Do not wear jewelry, make-up, hairpins, clips or nail polish. Do not wear lotions, powders, or perfumes.  Do not shave 48 hours prior to surgery. Men may shave face and neck. Do not bring valuables to the hospital.    [facility] is not responsible for any belongings or  valuables.  Contacts, dentures/partials or body piercings may not be worn into surgery. Bring a case for your contacts, glasses or hearing aids, a denture cup will be supplied. Leave your suitcase in the car. After surgery it may be brought to your room. For patients admitted to the hospital, discharge time is determined by your treatment team.   Patients discharged the day of surgery will not be allowed to drive home.   Please read over the following fact sheets that you were given:   MRSA Information  __X__ Take these medicines the morning of surgery with A SIP OF WATER:  1. none  2.   3.   4.  5.  6.  ____ Fleet Enema (as directed)   ____ Use CHG Soap/SAGE wipes as directed  ____ Use inhalers on the day of surgery  ____ Stop metformin/Janumet/Farxiga 2 days prior to surgery    ____ Take 1/2 of usual insulin dose the night before surgery. No insulin the morning          of surgery.   ____ Stop Blood Thinners Coumadin/Plavix/Xarelto/Pleta/Pradaxa/Eliquis/Effient/Aspirin  on   Or contact your Surgeon, Cardiologist or Medical Doctor regarding  ability to stop your blood thinners  __X__ Stop Anti-inflammatories 7 days before surgery such as Advil, Ibuprofen, Motrin,  BC or Goodies Powder, Naprosyn, Naproxen, Aleve, Aspirin    __X__ Stop all herbal supplements, fish oil or vitamin E until after surgery.    ____ Bring C-Pap to the hospital.    The Ensure Pre Surgery drink should be finished 2 hours prior to your arrival  Review the Incentive Spirometer instructions   How to Use an Incentive Spirometer An incentive spirometer is a tool that measures how well you are filling your lungs with each breath. Learning to take long, deep breaths using this tool can help you keep your lungs clear and active. This may help to reverse or lessen your chance of developing breathing (pulmonary) problems, especially infection. You may be asked to use a spirometer:  After a  surgery.  If you have a lung problem or a history of smoking.  After a long period of time when you have been unable to move or be active. If the spirometer includes an indicator to show the highest number that you have reached, your health care provider or respiratory therapist will help you set a goal. Keep a list (log) of your progress as told by your health care provider. What are the risks?  Breathing too quickly may cause dizziness or cause you to pass out. Take your time so you do not get dizzy or light-headed.  If you are in pain, you may need to take pain medicine before doing incentive spirometry. It is harder to take a deep breath if you are having pain. How to use your incentive spirometer  1. Sit up on the edge of your bed or on a chair. 2. Hold the incentive spirometer so that it is in an upright position. 3. Before you use the spirometer, breathe out normally. 4. Place the mouthpiece in your mouth. Make sure your lips are closed tightly around it. 5. Breathe in slowly and as deeply as you can through your mouth, causing the piston or the ball to rise toward the top of the chamber. 6. Hold your breath for 3-5 seconds, or for as long as possible. ? If the spirometer includes a coach indicator, use this to guide you in breathing. Slow down your breathing if the indicator goes above the marked areas. 7. Remove the mouthpiece from your mouth and breathe out normally. The piston or ball will return to the bottom of the chamber. 8. Rest for a few seconds, then repeat the steps 10 or more times. ? Take your time and take a few normal breaths between deep breaths so that you do not get dizzy or light-headed. ? Do this every 1-2 hours when you are awake. 9. If the spirometer includes a goal marker to show the highest number you have reached (best effort), use this as a goal to work toward during each repetition. 10. After  each set of 10 deep breaths, cough a few times. This will help to  make sure that your lungs are clear. ? If you have an incision on your chest or abdomen from surgery, place a pillow or a rolled-up towel firmly against the incision when you cough. This can help to reduce pain from coughing. General tips  When you become able to get out of bed, walk around often and continue to cough to help clear your lungs.  Keep using the incentive spirometer until your health care provider says it is okay to stop using it. If you have been in the hospital, you may be told to keep using the spirometer at home. Contact a health care provider if:  You are having difficulty using the spirometer.  You have trouble using the spirometer as often as instructed.  Your pain medicine is not giving enough relief for you to use the spirometer as told.  You have a fever.  You develop shortness of breath. Get help right away if:  You develop a cough with bloody mucus from the lungs (bloody sputum).  You have fluid or blood coming from an incision site after you cough. Summary  An incentive spirometer is a tool that can help you learn to take long, deep breaths to keep your lungs clear and active.  You may be asked to use a spirometer after a surgery, if you have a lung problem or a history of smoking, or if you have been inactive for a long period of time.  Use your incentive spirometer as instructed every 1-2 hours while you are awake.  If you have an incision on your chest or abdomen, place a pillow or a rolled-up towel firmly against your incision when you cough. This will help to reduce pain. This information is not intended to replace advice given to you by your health care provider. Make sure you discuss any questions you have with your health care provider. Document Revised: 04/03/2019 Document Reviewed: 07/17/2017 Elsevier Patient Education  2020 [employer].

## 2020-02-17 ENCOUNTER — Other Ambulatory Visit: Payer: Self-pay

## 2020-02-17 ENCOUNTER — Other Ambulatory Visit
Admission: RE | Admit: 2020-02-17 | Discharge: 2020-02-17 | Disposition: A | Discharge status: Home / Self Care | Payer: 59 | Source: Ambulatory Visit | Attending: Obstetrics and Gynecology | Admitting: Obstetrics and Gynecology

## 2020-02-17 DIAGNOSIS — Z01812 Encounter for preprocedural laboratory examination: Secondary | ICD-10-CM | POA: Diagnosis present

## 2020-02-17 DIAGNOSIS — Z20822 Contact with and (suspected) exposure to covid-19: Secondary | ICD-10-CM | POA: Insufficient documentation

## 2020-02-17 LAB — CBC
HCT: 37 % (ref 36.0–46.0)
Hemoglobin: 12.2 g/dL (ref 12.0–15.0)
MCH: 29.7 pg (ref 26.0–34.0)
MCHC: 33 g/dL (ref 30.0–36.0)
MCV: 90 fL (ref 80.0–100.0)
Platelets: 171 10*3/uL (ref 150–400)
RBC: 4.11 MIL/uL (ref 3.87–5.11)
RDW: 13.2 % (ref 11.5–15.5)
WBC: 4.5 10*3/uL (ref 4.0–10.5)
nRBC: 0 % (ref 0.0–0.2)

## 2020-02-17 LAB — TYPE AND SCREEN
ABO/RH(D): O POS
Antibody Screen: NEGATIVE

## 2020-02-17 LAB — BASIC METABOLIC PANEL
Anion gap: 8 (ref 5–15)
BUN: 10 mg/dL (ref 6–20)
CO2: 25 mmol/L (ref 22–32)
Calcium: 8.9 mg/dL (ref 8.9–10.3)
Chloride: 106 mmol/L (ref 98–111)
Creatinine, Ser: 0.65 mg/dL (ref 0.44–1.00)
GFR calc Af Amer: >60 mL/min (ref >60)
GFR calc non Af Amer: >60 mL/min (ref >60)
Glucose, Bld: 100 mg/dL — ABNORMAL HIGH (ref 70–99)
Potassium: 3.9 mmol/L (ref 3.5–5.1)
Sodium: 139 mmol/L (ref 135–145)

## 2020-02-17 LAB — SARS CORONAVIRUS 2 (TAT 6-24 HRS): SARS Coronavirus 2: NEGATIVE

## 2020-02-19 ENCOUNTER — Encounter: Payer: Self-pay | Admitting: Obstetrics and Gynecology

## 2020-02-19 ENCOUNTER — Ambulatory Visit: Payer: 59 | Admitting: Anesthesiology

## 2020-02-19 ENCOUNTER — Other Ambulatory Visit: Payer: Self-pay

## 2020-02-19 ENCOUNTER — Ambulatory Visit
Admission: RE | Admit: 2020-02-19 | Discharge: 2020-02-19 | Disposition: A | Discharge status: Home / Self Care | Payer: 59 | Attending: Obstetrics and Gynecology | Admitting: Obstetrics and Gynecology

## 2020-02-19 ENCOUNTER — Encounter
Admission: RE | Disposition: A | Discharge status: Home / Self Care | Payer: Self-pay | Source: Home / Self Care | Attending: Obstetrics and Gynecology

## 2020-02-19 DIAGNOSIS — D259 Leiomyoma of uterus, unspecified: Secondary | ICD-10-CM | POA: Diagnosis not present

## 2020-02-19 DIAGNOSIS — Z9851 Tubal ligation status: Secondary | ICD-10-CM | POA: Diagnosis not present

## 2020-02-19 DIAGNOSIS — N920 Excessive and frequent menstruation with regular cycle: Secondary | ICD-10-CM | POA: Insufficient documentation

## 2020-02-19 DIAGNOSIS — Z87891 Personal history of nicotine dependence: Secondary | ICD-10-CM | POA: Diagnosis not present

## 2020-02-19 DIAGNOSIS — Z8616 Personal history of COVID-19: Secondary | ICD-10-CM | POA: Insufficient documentation

## 2020-02-19 HISTORY — PX: DILATION AND CURETTAGE OF UTERUS: SHX78

## 2020-02-19 HISTORY — PX: HYSTEROSCOPY WITH NOVASURE: SHX5574

## 2020-02-19 LAB — POCT PREGNANCY, URINE: Preg Test, Ur: NEGATIVE

## 2020-02-19 LAB — ABO/RH: ABO/RH(D): O POS

## 2020-02-19 SURGERY — HYSTEROSCOPY WITH NOVASURE
Anesthesia: General

## 2020-02-19 MED ORDER — FENTANYL CITRATE (PF) 100 MCG/2ML IJ SOLN
INTRAMUSCULAR | Status: AC
  Administered 2020-02-19: 25 ug via INTRAVENOUS
  Filled 2020-02-19: qty 2

## 2020-02-19 MED ORDER — PROPOFOL 10 MG/ML IV BOLUS
INTRAVENOUS | Status: DC | PRN
  Administered 2020-02-19: 130 mg via INTRAVENOUS
  Administered 2020-02-19: 30 mg via INTRAVENOUS

## 2020-02-19 MED ORDER — MIDAZOLAM HCL 2 MG/2ML IJ SOLN
INTRAMUSCULAR | Status: AC
  Filled 2020-02-19: qty 2

## 2020-02-19 MED ORDER — OXYCODONE HCL 5 MG/5ML PO SOLN: 5.0000 mg | Freq: Once | ORAL | Status: AC | PRN

## 2020-02-19 MED ORDER — FENTANYL CITRATE (PF) 100 MCG/2ML IJ SOLN
INTRAMUSCULAR | Status: DC | PRN
  Administered 2020-02-19 (×2): 50 ug via INTRAVENOUS

## 2020-02-19 MED ORDER — FENTANYL CITRATE (PF) 100 MCG/2ML IJ SOLN
INTRAMUSCULAR | Status: AC
  Filled 2020-02-19: qty 2

## 2020-02-19 MED ORDER — SILVER NITRATE-POT NITRATE 75-25 % EX MISC
CUTANEOUS | Status: AC
  Filled 2020-02-19: qty 10

## 2020-02-19 MED ORDER — CHLORHEXIDINE GLUCONATE 0.12 % MT SOLN
OROMUCOSAL | Status: AC
  Filled 2020-02-19: qty 15

## 2020-02-19 MED ORDER — FENTANYL CITRATE (PF) 100 MCG/2ML IJ SOLN
25.0000 ug | INTRAMUSCULAR | Status: DC | PRN
  Administered 2020-02-19: 25 ug via INTRAVENOUS

## 2020-02-19 MED ORDER — FAMOTIDINE 20 MG PO TABS
ORAL_TABLET | ORAL | Status: AC
  Filled 2020-02-19: qty 1

## 2020-02-19 MED ORDER — FAMOTIDINE 20 MG PO TABS
20.0000 mg | ORAL_TABLET | Freq: Once | ORAL | Status: AC
  Administered 2020-02-19: 20 mg via ORAL

## 2020-02-19 MED ORDER — DEXAMETHASONE SODIUM PHOSPHATE 10 MG/ML IJ SOLN
INTRAMUSCULAR | Status: DC | PRN
  Administered 2020-02-19: 10 mg via INTRAVENOUS

## 2020-02-19 MED ORDER — LACTATED RINGERS IV SOLN: INTRAVENOUS | Status: DC | PRN

## 2020-02-19 MED ORDER — PROMETHAZINE HCL 25 MG/ML IJ SOLN: 6.2500 mg | INTRAMUSCULAR | Status: DC | PRN

## 2020-02-19 MED ORDER — LACTATED RINGERS IV SOLN: INTRAVENOUS | Status: DC

## 2020-02-19 MED ORDER — SEVOFLURANE IN SOLN
RESPIRATORY_TRACT | Status: AC
  Filled 2020-02-19: qty 250

## 2020-02-19 MED ORDER — OXYCODONE HCL 5 MG PO TABS
ORAL_TABLET | ORAL | Status: AC
  Filled 2020-02-19: qty 1

## 2020-02-19 MED ORDER — CHLORHEXIDINE GLUCONATE 0.12 % MT SOLN
15.0000 mL | Freq: Once | OROMUCOSAL | Status: AC
  Administered 2020-02-19: 15 mL via OROMUCOSAL

## 2020-02-19 MED ORDER — LIDOCAINE HCL (CARDIAC) PF 100 MG/5ML IV SOSY
PREFILLED_SYRINGE | INTRAVENOUS | Status: DC | PRN
  Administered 2020-02-19: 60 mg via INTRAVENOUS

## 2020-02-19 MED ORDER — MIDAZOLAM HCL 2 MG/2ML IJ SOLN
INTRAMUSCULAR | Status: DC | PRN
  Administered 2020-02-19: 2 mg via INTRAVENOUS

## 2020-02-19 MED ORDER — ORAL CARE MOUTH RINSE: 15.0000 mL | Freq: Once | OROMUCOSAL | Status: AC

## 2020-02-19 MED ORDER — OXYCODONE HCL 5 MG PO TABS
5.0000 mg | ORAL_TABLET | Freq: Once | ORAL | Status: AC | PRN
  Administered 2020-02-19: 5 mg via ORAL

## 2020-02-19 MED ORDER — ONDANSETRON HCL 4 MG/2ML IJ SOLN
INTRAMUSCULAR | Status: DC | PRN
  Administered 2020-02-19: 4 mg via INTRAVENOUS

## 2020-02-19 SURGICAL SUPPLY — 17 items
ABLATOR SURESOUND NOVASURE (ABLATOR) ×4 IMPLANT
CANISTER SUCT 1200ML W/VALVE (MISCELLANEOUS) ×4 IMPLANT
CATH ROBINSON RED A/P 16FR (CATHETERS) ×4 IMPLANT
COVER WAND RF STERILE (DRAPES) ×4 IMPLANT
GLOVE BIO SURGEON STRL SZ7 (GLOVE) ×4 IMPLANT
GLOVE INDICATOR 7.5 STRL GRN (GLOVE) ×4 IMPLANT
GOWN STRL REUS W/ TWL LRG LVL3 (GOWN DISPOSABLE) ×4 IMPLANT
GOWN STRL REUS W/TWL LRG LVL3 (GOWN DISPOSABLE) ×4
IV LACTATED RINGERS 1000ML (IV SOLUTION) ×4 IMPLANT
KIT TURNOVER CYSTO (KITS) ×4 IMPLANT
NS IRRIG 500ML POUR BTL (IV SOLUTION) ×4 IMPLANT
PACK DNC HYST (MISCELLANEOUS) ×4 IMPLANT
PAD OB MATERNITY 4.3X12.25 (PERSONAL CARE ITEMS) ×4 IMPLANT
PAD PREP 24X41 OB/GYN DISP (PERSONAL CARE ITEMS) ×4 IMPLANT
TOWEL OR 17X26 4PK STRL BLUE (TOWEL DISPOSABLE) ×4 IMPLANT
TUBING CONNECTING 10 (TUBING) ×3 IMPLANT
TUBING CONNECTING 10' (TUBING) ×1

## 2020-02-19 NOTE — Op Note (Signed)
Operative Report Hysteroscopy with Dilation and Curettage; Novasure ablation   Indications: Menorrhagia   Pre-operative Diagnosis: Abnormal uterine bleeding    Post-operative Diagnosis: same.  Procedure: 1. Exam under anesthesia 2. Fractional D&C with endocervical curettage  3. Hysteroscopy 4. Novasure endometrial ablation  Surgeon: Angelina Pih, MD  Assistant(s):  None  Anesthesia: General LMA anesthesia  Anesthesiologist: Piscitello, Precious Haws, MD Anesthesiologist: Piscitello, Precious Haws, MD; Tera Mater, MD CRNA: Justus Memory, CRNA  Estimated Blood Loss:  Minimal         Intraoperative medications: none         Total IV Fluids: see anesthesia  Urine Output: 64ml         Specimens: Endocervical curettings, endometrial curettings         Complications:  None; patient tolerated the procedure well.         Disposition: PACU - hemodynamically stable.         Condition: stable  Findings: Uterus measuring 10 cm by sound; normal cervix, vagina, perineum. Cervical length: 5.2 cm Uterine cavity length: 4.8 cm Uterine cavity width: 3.7 cm Power in watts: 98 Total time: 40m9sec  Indication for procedure/Consents: 39 y.o.  here for scheduled surgery for the aforementioned diagnoses.   Risks of surgery were discussed with the patient including but not limited to: bleeding which may require transfusion; infection which may require antibiotics; injury to uterus or surrounding organs; intrauterine scarring which may impair future fertility; need for additional procedures including laparotomy or laparoscopy; and other postoperative/anesthesia complications. Written informed consent was obtained.    Procedure Details:   The patient was taken to the operating room where LMA anesthesia was administered and was found to be adequate. After a formal and adequate timeout was performed, she was placed in the dorsal lithotomy position and examined with the above findings.  She was then prepped and draped in the sterile manner. Her bladder was catheterized for an estimated amount of clear, yellow urine. A weighed speculum was then placed in the patient's vagina and a single tooth tenaculum was applied to the anterior lip of the cervix.  An endocervical currettage was performed. Her cervix was serially dilated to 15 [place] using Hanks dilators.The hysteroscope was introduced to reveal the above findings.The hysteroscope was also used to determine the level of the internal os, and measurements were confirmed. The uterine cavity was carefully examined, both ostia were recognized, and diffusely proliferative endometrium with polypoid fragments was noted.  A sharp curettage was then performed until there was a gritty texture in all four quadrants.   NOVASURE PROCEDURE DETAILS:   The cervix was further dilated to accommodate the NovaSure device.  The NovaSure device was inserted, and the cavity width was determined. Using a power of 96 watts, for 69 sec, the endometrial ablation was performed. The hysteroscope was not re-introduced into the uterine cavity, to decrease the risk of pelvic infection. The tenaculum was removed from the anterior lip of the cervix, and the vaginal speculum was removed after noting good hemostasis.  She received iv acetaminophen and Toradol prior to leaving the OR. The patient tolerated the procedure well and was taken to the recovery area awake, extubated and in stable condition.  The patient will be discharged to home as per PACU criteria.  Routine postoperative instructions given.  She was prescribed Percocet, Ibuprofen and Colace.  She will follow up in the clinic in two weeks for postoperative evaluation.

## 2020-02-19 NOTE — Anesthesia Postprocedure Evaluation (Signed)
Anesthesia Post Note  Patient: Karen Lowery  Procedure(s) Performed: HYSTEROSCOPY WITH NOVASURE (N/A ) DILATATION AND CURETTAGE  Patient location during evaluation: PACU Anesthesia Type: General Level of consciousness: awake and alert Pain management: pain level controlled Vital Signs Assessment: post-procedure vital signs reviewed and stable Respiratory status: spontaneous breathing, nonlabored ventilation, respiratory function stable and patient connected to nasal cannula oxygen Cardiovascular status: blood pressure returned to baseline and stable Postop Assessment: no apparent nausea or vomiting Anesthetic complications: no     Last Vitals:  Vitals:   02/19/20 1442 02/19/20 1446  BP:  113/78  Pulse: 78 63  Resp: 15 14  Temp:    SpO2: 100% 100%    Last Pain:  Vitals:   02/19/20 1446  TempSrc:   PainSc: 6                  Precious Haws Venkat Ankney

## 2020-02-19 NOTE — Anesthesia Preprocedure Evaluation (Addendum)
Anesthesia Evaluation  Patient identified by MRN, date of birth, ID band Patient awake    Reviewed: Allergy & Precautions, H&P , NPO status , Patient's Chart, lab work & pertinent test results  Airway Mallampati: II  TM Distance: >3 FB Neck ROM: full    Dental  (+) Teeth Intact   Pulmonary former smoker,    breath sounds clear to auscultation       Cardiovascular (-) anginanegative cardio ROS  (-) dysrhythmias  Rhythm:regular Rate:Normal     Neuro/Psych negative neurological ROS  negative psych ROS   GI/Hepatic negative GI ROS, Neg liver ROS,   Endo/Other  negative endocrine ROS  Renal/GU      Musculoskeletal   Abdominal   Peds  Hematology negative hematology ROS (+)   Anesthesia Other Findings Past Medical History: 05/2019: History of 2019 novel coronavirus disease (COVID-19)     Comment:  Tested + on 05/19/2019  Past Surgical History: No date: IUD REMOVAL No date: TUBAL LIGATION     Reproductive/Obstetrics negative OB ROS                           Anesthesia Physical Anesthesia Plan  ASA: II  Anesthesia Plan: General LMA   Post-op Pain Management:    Induction:   PONV Risk Score and Plan: Dexamethasone, Ondansetron, Midazolam and Treatment may vary due to age or medical condition  Airway Management Planned:   Additional Equipment:   Intra-op Plan:   Post-operative Plan:   Informed Consent: I have reviewed the patients History and Physical, chart, labs and discussed the procedure including the risks, benefits and alternatives for the proposed anesthesia with the patient or authorized representative who has indicated his/her understanding and acceptance.     Dental Advisory Given  Plan Discussed with: Anesthesiologist, CRNA and Surgeon  Anesthesia Plan Comments:         Anesthesia Quick Evaluation

## 2020-02-19 NOTE — OR Nursing (Signed)
Provided with incentive spirometer, instructed with return demonstration.

## 2020-02-19 NOTE — Interval H&P Note (Signed)
History and Physical Interval Note:  02/19/2020 1:11 PM  Karen Lowery  has presented today for surgery, with the diagnosis of fibroids, menorrhagia.  The various methods of treatment have been discussed with the patient and family. After consideration of risks, benefits and other options for treatment, the patient has consented to  Procedure(s): HYSTEROSCOPY WITH NOVASURE (N/A) as a surgical intervention.  The patient's history has been reviewed, patient examined, no change in status, stable for surgery.  I have reviewed the patient's chart and labs.  Questions were answered to the patient's satisfaction.     Benjaman Kindler

## 2020-02-19 NOTE — Discharge Instructions (Addendum)
Discharge instructions after a hysteroscopy with dilation and curettage  Signs and Symptoms to Report  Call our office at [phone] if you have any of the following:   . Fever over 100.4 degrees or higher . Severe stomach pain not relieved with pain medications . Bright red bleeding that's heavier than a period that does not slow with rest after the first 24 hours . To go the bathroom a lot (frequency), you can't hold your urine (urgency), or it hurts when you empty your bladder (urinate) . Chest pain . Shortness of breath . Pain in the calves of your legs . Severe nausea and vomiting not relieved with anti-nausea medications . Any concerns  What You Can Expect after Surgery . You may see some pink tinged, bloody fluid. This is normal. You may also have cramping for several days.   Activities after Your Discharge Follow these guidelines to help speed your recovery at home: . Don't drive if you are in pain or taking narcotic pain medicine. You may drive when you can safely slam on the brakes, turn the wheel forcefully, and rotate your torso comfortably. This is typically 4-7 days. Practice in a parking lot or side street prior to attempting to drive regularly.  . Ask others to help with household chores for 4 weeks. . Don't do strenuous activities, exercises, or sports like vacuuming, tennis, squash, etc. until your doctor says it is safe to do so. . Walk as you feel able. Rest often since it may take a week or two for your energy level to return to normal.  . You may climb stairs . Avoid constipation:   -Eat fruits, vegetables, and whole grains. Eat small meals as your appetite will take time to return to normal.   -Drink 6 to 8 glasses of water each day unless your doctor has told you to limit your fluids.   -Use a laxative or stool softener as needed if constipation becomes a problem. You may take Miralax, metamucil, Citrucil, Colace, Senekot, FiberCon, etc. If this does not  relieve the constipation, try two tablespoons of Milk Of Magnesia every 8 hours until your bowels move.  . You may shower.  . Do not get in a hot tub, swimming pool, etc. until your doctor agrees. . Do not douche, use tampons, or have sex until your doctor says it is okay, usually about 2 weeks. . Take your pain medicine when you need it. The medicine may not work as well if the pain is bad.  Take the medicines you were taking before surgery. Other medications you might need are pain medications (ibuprofen), medications for constipation (Colace) and nausea medications (Zofran).        AMBULATORY SURGERY  DISCHARGE INSTRUCTIONS   1) The drugs that you were given will stay in your system until tomorrow so for the next 24 hours you should not:  A) Drive an automobile B) Make any legal decisions C) Drink any alcoholic beverage   2) You may resume regular meals tomorrow.  Today it is better to start with liquids and gradually work up to solid foods.  You may eat anything you prefer, but it is better to start with liquids, then soup and crackers, and gradually work up to solid foods.   3) Please notify your doctor immediately if you have any unusual bleeding, trouble breathing, redness and pain at the surgery site, drainage, fever, or pain not relieved by medication.    4) Additional Instructions:          Please contact your physician with any problems or Same Day Surgery at [phone], Monday through Friday 6 am to 4 pm, or [place] [facility] [place] Main number at [phone]. 

## 2020-02-19 NOTE — Anesthesia Procedure Notes (Signed)
Procedure Name: LMA Insertion Performed by: Justus Memory, CRNA Pre-anesthesia Checklist: Patient identified, Patient being monitored, Timeout performed, Emergency Drugs available and Suction available Patient Re-evaluated:Patient Re-evaluated prior to induction Oxygen Delivery Method: Circle system utilized Preoxygenation: Pre-oxygenation with 100% oxygen Induction Type: IV induction Ventilation: Mask ventilation without difficulty LMA: LMA inserted LMA Size: 3.5 Tube type: Oral Number of attempts: 1 Placement Confirmation: positive ETCO2 and breath sounds checked- equal and bilateral Tube secured with: Tape Dental Injury: Teeth and Oropharynx as per pre-operative assessment

## 2020-02-19 NOTE — Transfer of Care (Signed)
Immediate Anesthesia Transfer of Care Note  Patient: Karen Lowery  Procedure(s) Performed: HYSTEROSCOPY WITH NOVASURE (N/A ) DILATATION AND CURETTAGE  Patient Location: PACU  Anesthesia Type:General  Level of Consciousness: sedated  Airway & Oxygen Therapy: Patient Spontanous Breathing and Patient connected to face mask oxygen  Post-op Assessment: Report given to RN and Post -op Vital signs reviewed and stable  Post vital signs: Reviewed and stable  Last Vitals:  Vitals Value Taken Time  BP 115/71 02/19/20 1415  Temp    Pulse 84 02/19/20 1416  Resp 15 02/19/20 1416  SpO2 100 % 02/19/20 1416  Vitals shown include unvalidated device data.  Last Pain:  Vitals:   02/19/20 1416  TempSrc:   PainSc: (P) Asleep         Complications: No apparent anesthesia complications

## 2020-02-23 LAB — SURGICAL PATHOLOGY

## 2021-04-03 ENCOUNTER — Emergency Department: Payer: 59

## 2021-04-03 ENCOUNTER — Other Ambulatory Visit: Payer: Self-pay

## 2021-04-03 ENCOUNTER — Emergency Department
Admission: EM | Admit: 2021-04-03 | Discharge: 2021-04-03 | Disposition: A | Discharge status: Home / Self Care | Payer: 59 | Attending: Emergency Medicine | Admitting: Emergency Medicine

## 2021-04-03 DIAGNOSIS — R630 Anorexia: Secondary | ICD-10-CM | POA: Insufficient documentation

## 2021-04-03 DIAGNOSIS — R110 Nausea: Secondary | ICD-10-CM | POA: Insufficient documentation

## 2021-04-03 DIAGNOSIS — R1011 Right upper quadrant pain: Secondary | ICD-10-CM | POA: Insufficient documentation

## 2021-04-03 DIAGNOSIS — Z87891 Personal history of nicotine dependence: Secondary | ICD-10-CM | POA: Insufficient documentation

## 2021-04-03 DIAGNOSIS — Z8616 Personal history of COVID-19: Secondary | ICD-10-CM | POA: Diagnosis not present

## 2021-04-03 LAB — URINALYSIS, COMPLETE (UACMP) WITH MICROSCOPIC
Bacteria, UA: NONE SEEN
Bilirubin Urine: NEGATIVE
Glucose, UA: NEGATIVE mg/dL
Hgb urine dipstick: NEGATIVE
Ketones, ur: 20 mg/dL — AB
Nitrite: NEGATIVE
Protein, ur: NEGATIVE mg/dL
Specific Gravity, Urine: 1.026 (ref 1.005–1.030)
pH: 5 (ref 5.0–8.0)

## 2021-04-03 LAB — COMPREHENSIVE METABOLIC PANEL
ALT: 14 U/L (ref 0–44)
AST: 17 U/L (ref 15–41)
Albumin: 4.2 g/dL (ref 3.5–5.0)
Alkaline Phosphatase: 56 U/L (ref 38–126)
Anion gap: 8 (ref 5–15)
BUN: 16 mg/dL (ref 6–20)
CO2: 24 mmol/L (ref 22–32)
Calcium: 9.2 mg/dL (ref 8.9–10.3)
Chloride: 104 mmol/L (ref 98–111)
Creatinine, Ser: 0.62 mg/dL (ref 0.44–1.00)
GFR, Estimated: >60 mL/min (ref >60)
Glucose, Bld: 107 mg/dL — ABNORMAL HIGH (ref 70–99)
Potassium: 4.8 mmol/L (ref 3.5–5.1)
Sodium: 136 mmol/L (ref 135–145)
Total Bilirubin: 1.3 mg/dL — ABNORMAL HIGH (ref 0.3–1.2)
Total Protein: 7.1 g/dL (ref 6.5–8.1)

## 2021-04-03 LAB — CBC
HCT: 43.9 % (ref 36.0–46.0)
Hemoglobin: 15.3 g/dL — ABNORMAL HIGH (ref 12.0–15.0)
MCH: 33 pg (ref 26.0–34.0)
MCHC: 34.9 g/dL (ref 30.0–36.0)
MCV: 94.8 fL (ref 80.0–100.0)
Platelets: 174 10*3/uL (ref 150–400)
RBC: 4.63 MIL/uL (ref 3.87–5.11)
RDW: 12.1 % (ref 11.5–15.5)
WBC: 8.4 10*3/uL (ref 4.0–10.5)
nRBC: 0 % (ref 0.0–0.2)

## 2021-04-03 LAB — POC URINE PREG, ED: Preg Test, Ur: NEGATIVE

## 2021-04-03 LAB — LIPASE, BLOOD: Lipase: 21 U/L (ref 11–51)

## 2021-04-03 MED ORDER — ONDANSETRON HCL 4 MG/2ML IJ SOLN
4.0000 mg | Freq: Once | INTRAMUSCULAR | Status: AC
  Administered 2021-04-03: 4 mg via INTRAVENOUS
  Filled 2021-04-03: qty 2

## 2021-04-03 MED ORDER — SODIUM CHLORIDE 0.9 % IV BOLUS
1000.0000 mL | Freq: Once | INTRAVENOUS | Status: AC
  Administered 2021-04-03: 1000 mL via INTRAVENOUS

## 2021-04-03 MED ORDER — IOHEXOL 300 MG/ML  SOLN
75.0000 mL | Freq: Once | INTRAMUSCULAR | Status: AC | PRN
  Administered 2021-04-03: 75 mL via INTRAVENOUS
  Filled 2021-04-03: qty 75

## 2021-04-03 MED ORDER — FENTANYL CITRATE (PF) 100 MCG/2ML IJ SOLN
50.0000 ug | Freq: Once | INTRAMUSCULAR | Status: AC
  Administered 2021-04-03: 50 ug via INTRAVENOUS
  Filled 2021-04-03: qty 2

## 2021-04-03 MED ORDER — FAMOTIDINE 20 MG PO TABS: 20.0000 mg | ORAL_TABLET | Freq: Every day | ORAL | 1 refills | Status: AC

## 2021-04-03 MED ORDER — SUCRALFATE 1 G PO TABS: 1.0000 g | ORAL_TABLET | Freq: Four times a day (QID) | ORAL | 0 refills | Status: AC

## 2021-04-03 NOTE — Discharge Instructions (Addendum)
Please seek medical attention for any high fevers, chest pain, shortness of breath, change in behavior, persistent vomiting, bloody stool or any other new or concerning symptoms.  

## 2021-04-03 NOTE — ED Notes (Signed)
US at bedside

## 2021-04-03 NOTE — ED Notes (Signed)
Pt presents to the ED from home. Pt c/o RUQ abd pain that started this morning. Pt states it a dull, aching pain. Pt also been nauseous. Pt is A&Ox4 and NAD. Denies diarrhea. Denies fever. Pt thinks it may have something to do with her gall bladder.

## 2021-04-03 NOTE — ED Provider Notes (Signed)
[facility] Emergency Department Provider Note    ____________________________________________   I have reviewed the triage vital signs and the nursing notes.   HISTORY  Chief Complaint Abdominal Pain   History limited by: Not Limited   HPI Karen Lowery is a 40 y.o. female who presents to the emergency department today because of concern for RUQ pain. The patient states that the pain started this morning. Has been constant. It is at times sharp. Rates it a 9/10. The patient says that it is accompanied by nausea and decreased appetite. The patient says that she had similar pain once a few months ago which she at that time attributed to red meat, and it did seem to get better after she stopped eating red meat. She denies any fevers. Denies any family history of gallbladder disease.   Records reviewed. No pertinent past medical history.  Past Medical History:  Diagnosis Date   History of 2019 novel coronavirus disease (COVID-19) 05/2019   Tested + on 05/19/2019    There are no problems to display for this patient.   Past Surgical History:  Procedure Laterality Date   AUGMENTATION MAMMAPLASTY     DILATION AND CURETTAGE OF UTERUS  02/19/2020   Procedure: DILATATION AND CURETTAGE;  Surgeon: Benjaman Kindler, MD;  Location: [facility] ORS;  Service: Gynecology;;   HYSTEROSCOPY WITH NOVASURE N/A 02/19/2020   Procedure: HYSTEROSCOPY WITH NOVASURE;  Surgeon: Benjaman Kindler, MD;  Location: [facility] ORS;  Service: Gynecology;  Laterality: N/A;   IUD REMOVAL     TUBAL LIGATION      Prior to Admission medications   Medication Sig Start Date End Date Taking? Authorizing Provider  valACYclovir (VALTREX) 1000 MG tablet Take 1,000 mg by mouth 2 (two) times daily as needed (arm).    [provider]    Allergies Patient has no known allergies.  No family history on file.  Social History Social History   Tobacco Use   Smoking status: Former    Types:  Cigarettes    Quit date: 2007    Years since quitting: 15.5   Smokeless tobacco: Never  Vaping Use   Vaping Use: Never used  Substance Use Topics   Alcohol use: Yes    Comment: rare   Drug use: Never    Review of Systems Constitutional: No fever/chills Eyes: No visual changes. ENT: No sore throat. Cardiovascular: Denies chest pain. Respiratory: Denies shortness of breath. Gastrointestinal: Positive for abdominal pain, nausea and vomiting.  Genitourinary: Negative for dysuria. Musculoskeletal: Negative for back pain. Skin: Negative for rash. Neurological: Negative for headaches, focal weakness or numbness.  ____________________________________________   PHYSICAL EXAM:  VITAL SIGNS: ED Triage Vitals  Enc Vitals Group     BP 04/03/21 1310 112/75     Pulse Rate 04/03/21 1310 (!) 101     Resp 04/03/21 1310 17     Temp 04/03/21 1310 98.5 F (36.9 C)     Temp Source 04/03/21 1310 Oral     SpO2 04/03/21 1310 98 %     Weight 04/03/21 1311 135 lb (61.2 kg)     Height 04/03/21 1311 5\' 3"  (1.6 m)     Head Circumference --      Peak Flow --      Pain Score 04/03/21 1311 9   Constitutional: Alert and oriented.  Eyes: Conjunctivae are normal.  ENT      Head: Normocephalic and atraumatic.      Nose: No congestion/rhinnorhea.  Mouth/Throat: Mucous membranes are moist.      Neck: No stridor. Hematological/Lymphatic/Immunilogical: No cervical lymphadenopathy. Cardiovascular: Normal rate, regular rhythm.  No murmurs, rubs, or gallops.  Respiratory: Normal respiratory effort without tachypnea nor retractions. Breath sounds are clear and equal bilaterally. No wheezes/rales/rhonchi. Gastrointestinal: Soft and tender to palpation in the RUQ. Genitourinary: Deferred Musculoskeletal: Normal range of motion in all extremities. No lower extremity edema. Neurologic:  Normal speech and language. No gross focal neurologic deficits are appreciated.  Skin:  Skin is warm, dry and intact.  No rash noted. Psychiatric: Mood and affect are normal. Speech and behavior are normal. Patient exhibits appropriate insight and judgment.  ____________________________________________    LABS (pertinent positives/negatives)  Upreg negative Lipase 21 UA hazy, ketones 20, trace leukocytes, 0-5 RBC and WBC, 11-20 squamous epi CMP wnl except glu 107, t bili 1.3 CBC wbc 8.4, hgb 15.3, plt 174  ____________________________________________   EKG  None  ____________________________________________    RADIOLOGY  RUQ [place] No acute abnormality  CT abd/pel Findings suggesting gastritis/PUD, possible ileus/enteritis. Fibroid. ____________________________________________   PROCEDURES  Procedures  ____________________________________________   INITIAL IMPRESSION / ASSESSMENT AND PLAN / ED COURSE  Pertinent labs & imaging results that were available during my care of the patient were reviewed by me and considered in my medical decision making (see chart for details).   Patient presented to the emergency department today because of concern for RUQ pain and was tender on exam. Blood work without leukocytosis. RUQ [place] without obvious etiology so CT was obtained. Did discuss risks and benefit with patient. CT did suggest gastritis/PUD, and I do think this could explain the patient's pain. Discussed this with the patient. Also discussed fibroid. Will discharge with medication to help with gastritis.   ____________________________________________   FINAL CLINICAL IMPRESSION(S) / ED DIAGNOSES  Final diagnoses:  RUQ pain     Note: This dictation was prepared with Dragon dictation. Any transcriptional errors that result from this process are unintentional     Nance Pear, MD 04/03/21 [id number]

## 2021-04-03 NOTE — ED Notes (Addendum)
Patient ambulatory to wheelchair with steady gait. Patient alert, oriented x4.

## 2021-04-03 NOTE — ED Notes (Signed)
ED Provider at bedside. 

## 2021-04-03 NOTE — ED Notes (Signed)
Patient transported to CT 

## 2021-04-03 NOTE — ED Triage Notes (Signed)
Pt c/o RUQ pain with N/V for the past 3 days. Denies diarrhea, non radiating pain

## 2021-04-06 ENCOUNTER — Encounter: Payer: Self-pay | Admitting: Internal Medicine

## 2021-04-14 HISTORY — PX: ESOPHAGOGASTRODUODENOSCOPY: SHX1529

## 2021-08-08 NOTE — Progress Notes (Deleted)
Referring Provider: [facility] Primary Care Physician:  Marinda Elk, MD Primary Gastroenterologist:  Dr. Gala Romney  No chief complaint on file.   HPI:   Karen Lowery is a 40 y.o. female presenting today at the request of   Patient saw Stephens November, NP with [facility] on 04/11/2021 for evaluation of abdominal pain.  She had been seen at [facility] ED on 04/03/2021 for RUQ pain/nausea. Labs - urine preg negative; lipase normal, cmp/cbc pretty unremarkable. RUQ US unremarkable. CT A/P with contrast with mild wall thickening of the pylorus with slight mucosal thickening, query gastritis versus PUD, fluid-filled nondilated small bowel with some fluid in the colon, question mild ileus or enteritis, and bulky uterus with possible fibroid. Discharged for o/p follow up with rx for pepcid and sucralfate.  Reported since that time, she had been taking OTC Prilosec twice daily, not the Carafate or Pepcid.  Reported her abdominal pain, nausea, and vomiting had resolved.  Reported black bits in stool, but no black tarry stools.  She was advised to continue PPI and scheduled for EGD.  Also recommended follow-up with GYN provider due to CT findings.  She underwent EGD on 04/14/2021 with Dr. Alice Reichert.  Results are not available in care everywhere. Per telephone note on 8/8, it appears pathology may have shown Barrett's esophagus.  Today:     Past Medical History:  Diagnosis Date   History of 2019 novel coronavirus disease (COVID-19) 05/2019   Tested + on 05/19/2019    Past Surgical History:  Procedure Laterality Date   AUGMENTATION MAMMAPLASTY     DILATION AND CURETTAGE OF UTERUS  02/19/2020   Procedure: DILATATION AND CURETTAGE;  Surgeon: Benjaman Kindler, MD;  Location: [facility];  Service: Gynecology;;   HYSTEROSCOPY WITH NOVASURE N/A 02/19/2020   Procedure: HYSTEROSCOPY WITH NOVASURE;  Surgeon: Benjaman Kindler, MD;  Location: [facility] OR[facility];  Service: Gynecology;  Laterality: N/A;    IUD REMOVAL     TUBAL LIGATION      Current Outpatient Medications  Medication Sig Dispense Refill   famotidine (PEPCID) 20 MG tablet Take 1 tablet (20 mg total) by mouth daily. 30 tablet 1   sucralfate (CARAFATE) 1 g tablet Take 1 tablet (1 g total) by mouth 4 (four) times daily. 60 tablet 0   valACYclovir (VALTREX) 1000 MG tablet Take 1,000 mg by mouth 2 (two) times daily as needed (arm).     No current facility-administered medications for this visit.    Allergies as of 08/09/2021   (No Known Allergies)    No family history on file.  Social History   Socioeconomic History   Marital status: Single    Spouse name: Not on file   Number of children: Not on file   Years of education: Not on file   Highest education level: Not on file  Occupational History   Not on file  Tobacco Use   Smoking status: Former    Types: Cigarettes    Quit date: 2007    Years since quitting: 15.9   Smokeless tobacco: Never  Vaping Use   Vaping Use: Never used  Substance and Sexual Activity   Alcohol use: Yes    Comment: rare   Drug use: Never   Sexual activity: Yes  Other Topics Concern   Not on file  Social History Narrative   Not on file   Social Determinants of Health   Financial Resource Strain: Not on file  Food Insecurity: Not on file  Transportation Needs: Not on file  Physical Activity: Not on file  Stress: Not on file  Social Connections: Not on file  Intimate Partner Violence: Not on file    Review of Systems: Gen: Denies any fever, chills, fatigue, weight loss, lack of appetite.  CV: Denies chest pain, heart palpitations, peripheral edema, syncope.  Resp: Denies shortness of breath at rest or with exertion. Denies wheezing or cough.  GI: Denies dysphagia or odynophagia. Denies jaundice, hematemesis, fecal incontinence. GU : Denies urinary burning, urinary frequency, urinary hesitancy MS: Denies joint pain, muscle weakness, cramps, or limitation of movement.  Derm:  Denies rash, itching, dry skin Psych: Denies depression, anxiety, memory loss, and confusion Heme: Denies bruising, bleeding, and enlarged lymph nodes.  Physical Exam: There were no vitals taken for this visit. General:   Alert and oriented. Pleasant and cooperative. Well-nourished and well-developed.  Head:  Normocephalic and atraumatic. Eyes:  Without icterus, sclera clear and conjunctiva pink.  Ears:  Normal auditory acuity. Nose:  No deformity, discharge,  or lesions. Mouth:  No deformity or lesions, oral mucosa pink.  Neck:  Supple, without mass or thyromegaly. Lungs:  Clear to auscultation bilaterally. No wheezes, rales, or rhonchi. No distress.  Heart:  S1, S2 present without murmurs appreciated.  Abdomen:  +BS, soft, non-tender and non-distended. No HSM noted. No guarding or rebound. No masses appreciated.  Rectal:  Deferred  Msk:  Symmetrical without gross deformities. Normal posture. Pulses:  Normal pulses noted. Extremities:  Without clubbing or edema. Neurologic:  Alert and  oriented x4;  grossly normal neurologically. Skin:  Intact without significant lesions or rashes. Cervical Nodes:  No significant cervical adenopathy. Psych:  Alert and cooperative. Normal mood and affect.

## 2021-08-09 ENCOUNTER — Ambulatory Visit: Payer: 59 | Admitting: Gastroenterology

## 2021-08-09 ENCOUNTER — Encounter: Payer: Self-pay | Admitting: Gastroenterology

## 2021-08-09 ENCOUNTER — Encounter: Payer: Self-pay | Admitting: Internal Medicine

## 2021-09-30 ENCOUNTER — Emergency Department: Payer: 59

## 2021-09-30 ENCOUNTER — Encounter: Payer: Self-pay | Admitting: Emergency Medicine

## 2021-09-30 ENCOUNTER — Emergency Department
Admission: EM | Admit: 2021-09-30 | Discharge: 2021-09-30 | Disposition: A | Discharge status: Home / Self Care | Payer: 59 | Attending: Emergency Medicine | Admitting: Emergency Medicine

## 2021-09-30 ENCOUNTER — Other Ambulatory Visit: Payer: Self-pay

## 2021-09-30 DIAGNOSIS — Z20822 Contact with and (suspected) exposure to covid-19: Secondary | ICD-10-CM | POA: Insufficient documentation

## 2021-09-30 DIAGNOSIS — J069 Acute upper respiratory infection, unspecified: Secondary | ICD-10-CM | POA: Diagnosis not present

## 2021-09-30 DIAGNOSIS — R059 Cough, unspecified: Secondary | ICD-10-CM | POA: Diagnosis present

## 2021-09-30 LAB — RESP PANEL BY RT-PCR (FLU A&B, COVID) ARPGX2
Influenza A by PCR: NEGATIVE
Influenza B by PCR: NEGATIVE
SARS Coronavirus 2 by RT PCR: NEGATIVE

## 2021-09-30 MED ORDER — PSEUDOEPH-BROMPHEN-DM 30-2-10 MG/5ML PO SYRP: 5.0000 mL | ORAL_SOLUTION | Freq: Four times a day (QID) | ORAL | 0 refills | Status: AC | PRN

## 2021-09-30 NOTE — Discharge Instructions (Addendum)
Follow-up with your primary care provider or urgent care if any continued problems.  Increase fluids to stay hydrated.  Tylenol or ibuprofen as needed for body aches, headache or fever.  A prescription for Bromfed-DM was sent to the pharmacy for congestion and cough.  If any severe worsening of your symptoms return to the emergency department.  The COVID and influenza swab were negative.

## 2021-09-30 NOTE — ED Provider Notes (Signed)
[facility] Provider Note    Event Date/Time   First MD Initiated Contact with Patient 09/30/21 1439     (approximate)   History   URI   HPI  Karen Lowery is a 41 y.o. female presents to the ED with complaint of cough and congestion for the last 3 days.  Patient reports productive cough and chills.  She is not aware of any exposure to COVID or influenza.  Patient did not get the COVID-vaccine.  Patient is a former smoker and quit in 2007.     Physical Exam   Triage Vital Signs: ED Triage Vitals  Enc Vitals Group     BP 09/30/21 1246 (!) 128/98     Pulse Rate 09/30/21 1246 (!) 101     Resp 09/30/21 1246 16     Temp 09/30/21 1246 98.2 F (36.8 C)     Temp Source 09/30/21 1246 Oral     SpO2 09/30/21 1246 100 %     Weight 09/30/21 1220 134 lb 14.7 oz (61.2 kg)     Height 09/30/21 1220 5\' 3"  (1.6 m)     Head Circumference --      Peak Flow --      Pain Score 09/30/21 1220 0     Pain Loc --      Pain Edu? --      Excl. in [place]? --     Most recent vital signs: Vitals:   09/30/21 1246  BP: (!) 128/98  Pulse: (!) 101  Resp: 16  Temp: 98.2 F (36.8 C)  SpO2: 100%     General: Awake, no distress.  Ambulatory without any assistance. CV:  Good peripheral perfusion.   Resp:  Normal effort. Lungs with rare occasional expiratory wheeze that clears with cough. Abd:  No distention.  Other:  Mild to moderate nasal congestion.  Posterior pharynx with drainage noted but no erythema or exudate is noted.  No rashes present.   ED Results / Procedures / Treatments   Labs (all labs ordered are listed, but only abnormal results are displayed) Labs Reviewed  RESP PANEL BY RT-PCR (FLU A&B, COVID) [id number]      RADIOLOGY Chest x-ray reviewed and radiology report is negative for any acute cardiopulmonary disease.    PROCEDURES:  Critical Care performed: No  Procedures   MEDICATIONS ORDERED IN ED: Medications - No data to  display   IMPRESSION / MDM / [place] / ED COURSE  I reviewed the triage vital signs and the nursing notes.   Differential diagnosis includes, but is not limited to, viral upper respiratory infection, influenza, COVID, bronchitis.   41 year old female presents to the ED with complaint of cough, congestion and chills for the last 3 days.  Patient is unaware of any known exposure to COVID or influenza.  Chest x-ray was negative for acute changes and influenza/COVID swab was negative.  Patient was made aware.  We discussed fluids to stay hydrated, ibuprofen or Tylenol as needed for body aches, headache or fever and a prescription for Bromfed-DM was sent to the pharmacy.  Patient was made aware that she should return to the emergency department if any severe worsening of her symptoms such as difficulty breathing or shortness of breath.    FINAL CLINICAL IMPRESSION(S) / ED DIAGNOSES   Final diagnoses:  Viral URI with cough     Rx / DC Orders   ED Discharge Orders  Ordered    brompheniramine-pseudoephedrine-DM 30-2-10 MG/5ML syrup  4 times daily PRN        09/30/21 1559             Note:  This document was prepared using Dragon voice recognition software and may include unintentional dictation errors.   Johnn Hai, PA-C 09/30/21 1604    Nena Polio, MD 09/30/21 [phone]

## 2021-09-30 NOTE — ED Triage Notes (Signed)
C/O cough and congestion x 3 days.  Also chills.  Cough productive for thick yellow / green.  No SOB/ DOE.  NAD

## 2021-12-04 ENCOUNTER — Other Ambulatory Visit: Payer: Self-pay | Admitting: Gastroenterology

## 2021-12-04 DIAGNOSIS — R1013 Epigastric pain: Secondary | ICD-10-CM

## 2021-12-22 ENCOUNTER — Encounter: Admission: RE | Admit: 2021-12-22 | Payer: 59 | Source: Ambulatory Visit

## 2021-12-22 ENCOUNTER — Ambulatory Visit
Admission: RE | Admit: 2021-12-22 | Discharge: 2021-12-22 | Disposition: A | Discharge status: Home / Self Care | Payer: 59 | Source: Ambulatory Visit | Attending: Gastroenterology | Admitting: Gastroenterology

## 2021-12-22 DIAGNOSIS — R1013 Epigastric pain: Secondary | ICD-10-CM | POA: Diagnosis present

## 2021-12-22 MED ORDER — TECHNETIUM TC 99M SULFUR COLLOID
2.0000 | Freq: Once | INTRAVENOUS | Status: AC
  Administered 2021-12-22: 2.34 via ORAL

## 2022-06-29 DIAGNOSIS — H60541 Acute eczematoid otitis externa, right ear: Secondary | ICD-10-CM | POA: Diagnosis not present

## 2022-06-29 DIAGNOSIS — E538 Deficiency of other specified B group vitamins: Secondary | ICD-10-CM | POA: Diagnosis not present

## 2022-06-29 DIAGNOSIS — Z0000 Encounter for general adult medical examination without abnormal findings: Secondary | ICD-10-CM | POA: Diagnosis not present

## 2022-06-29 DIAGNOSIS — Z91018 Allergy to other foods: Secondary | ICD-10-CM | POA: Diagnosis not present

## 2022-08-06 DIAGNOSIS — R1012 Left upper quadrant pain: Secondary | ICD-10-CM | POA: Diagnosis not present

## 2022-08-06 DIAGNOSIS — R197 Diarrhea, unspecified: Secondary | ICD-10-CM | POA: Diagnosis not present

## 2022-08-06 DIAGNOSIS — K219 Gastro-esophageal reflux disease without esophagitis: Secondary | ICD-10-CM | POA: Diagnosis not present

## 2022-08-06 DIAGNOSIS — R1032 Left lower quadrant pain: Secondary | ICD-10-CM | POA: Diagnosis not present

## 2022-08-06 DIAGNOSIS — K2270 Barrett's esophagus without dysplasia: Secondary | ICD-10-CM | POA: Diagnosis not present

## 2022-11-09 ENCOUNTER — Ambulatory Visit
Admission: RE | Admit: 2022-11-09 | Discharge: 2022-11-09 | Disposition: A | Discharge status: Home / Self Care | Payer: 59 | Attending: Gastroenterology | Admitting: Gastroenterology

## 2022-11-09 ENCOUNTER — Encounter
Admission: RE | Disposition: A | Discharge status: Home / Self Care | Payer: Self-pay | Source: Home / Self Care | Attending: Gastroenterology

## 2022-11-09 ENCOUNTER — Ambulatory Visit: Payer: 59 | Admitting: Anesthesiology

## 2022-11-09 DIAGNOSIS — K3184 Gastroparesis: Secondary | ICD-10-CM | POA: Diagnosis not present

## 2022-11-09 DIAGNOSIS — R1013 Epigastric pain: Secondary | ICD-10-CM | POA: Diagnosis not present

## 2022-11-09 DIAGNOSIS — K640 First degree hemorrhoids: Secondary | ICD-10-CM | POA: Insufficient documentation

## 2022-11-09 DIAGNOSIS — K2270 Barrett's esophagus without dysplasia: Secondary | ICD-10-CM | POA: Diagnosis not present

## 2022-11-09 DIAGNOSIS — K219 Gastro-esophageal reflux disease without esophagitis: Secondary | ICD-10-CM | POA: Diagnosis not present

## 2022-11-09 DIAGNOSIS — R1012 Left upper quadrant pain: Secondary | ICD-10-CM | POA: Insufficient documentation

## 2022-11-09 DIAGNOSIS — K2970 Gastritis, unspecified, without bleeding: Secondary | ICD-10-CM | POA: Insufficient documentation

## 2022-11-09 DIAGNOSIS — Z87891 Personal history of nicotine dependence: Secondary | ICD-10-CM | POA: Diagnosis not present

## 2022-11-09 DIAGNOSIS — K5730 Diverticulosis of large intestine without perforation or abscess without bleeding: Secondary | ICD-10-CM | POA: Diagnosis not present

## 2022-11-09 DIAGNOSIS — R1032 Left lower quadrant pain: Secondary | ICD-10-CM | POA: Diagnosis not present

## 2022-11-09 DIAGNOSIS — K2289 Other specified disease of esophagus: Secondary | ICD-10-CM | POA: Diagnosis not present

## 2022-11-09 DIAGNOSIS — R197 Diarrhea, unspecified: Secondary | ICD-10-CM | POA: Diagnosis not present

## 2022-11-09 HISTORY — PX: COLONOSCOPY WITH PROPOFOL: SHX5780

## 2022-11-09 HISTORY — PX: ESOPHAGOGASTRODUODENOSCOPY (EGD) WITH PROPOFOL: SHX5813

## 2022-11-09 LAB — POCT PREGNANCY, URINE: Preg Test, Ur: NEGATIVE

## 2022-11-09 SURGERY — COLONOSCOPY WITH PROPOFOL
Anesthesia: General

## 2022-11-09 MED ORDER — SODIUM CHLORIDE 0.9 % IV SOLN: INTRAVENOUS | Status: DC

## 2022-11-09 MED ORDER — PROPOFOL 10 MG/ML IV BOLUS
INTRAVENOUS | Status: DC | PRN
  Administered 2022-11-09: 20 mg via INTRAVENOUS
  Administered 2022-11-09: 10 mg via INTRAVENOUS
  Administered 2022-11-09: 50 mg via INTRAVENOUS

## 2022-11-09 MED ORDER — MIDAZOLAM HCL 2 MG/2ML IJ SOLN
INTRAMUSCULAR | Status: DC | PRN
  Administered 2022-11-09: 2 mg via INTRAVENOUS

## 2022-11-09 MED ORDER — PROPOFOL 500 MG/50ML IV EMUL
INTRAVENOUS | Status: DC | PRN
  Administered 2022-11-09: 155 ug/kg/min via INTRAVENOUS

## 2022-11-09 MED ORDER — LIDOCAINE HCL (CARDIAC) PF 100 MG/5ML IV SOSY
PREFILLED_SYRINGE | INTRAVENOUS | Status: DC | PRN
  Administered 2022-11-09: 100 mg via INTRAVENOUS

## 2022-11-09 MED ORDER — MIDAZOLAM HCL 2 MG/2ML IJ SOLN
INTRAMUSCULAR | Status: AC
  Filled 2022-11-09: qty 2

## 2022-11-09 MED ORDER — GLYCOPYRROLATE 0.2 MG/ML IJ SOLN
INTRAMUSCULAR | Status: DC | PRN
  Administered 2022-11-09: .2 mg via INTRAVENOUS

## 2022-11-09 NOTE — Anesthesia Preprocedure Evaluation (Signed)
Anesthesia Evaluation  Patient identified by MRN, date of birth, ID band Patient awake    Reviewed: Allergy & Precautions, NPO status , Patient's Chart, lab work & pertinent test results  Airway Mallampati: II  TM Distance: >3 FB Neck ROM: full    Dental  (+) Chipped   Pulmonary neg pulmonary ROS   Pulmonary exam normal        Cardiovascular negative cardio ROS Normal cardiovascular exam     Neuro/Psych negative neurological ROS  negative psych ROS   GI/Hepatic negative GI ROS, Neg liver ROS,neg GERD  ,,  Endo/Other  negative endocrine ROS    Renal/GU negative Renal ROS  negative genitourinary   Musculoskeletal   Abdominal   Peds  Hematology negative hematology ROS (+)   Anesthesia Other Findings Past Medical History: 05/2019: History of 2019 novel coronavirus disease (COVID-19)     Comment:  Tested + on 05/19/2019  Past Surgical History: No date: AUGMENTATION MAMMAPLASTY 02/19/2020: DILATION AND CURETTAGE OF UTERUS     Comment:  Procedure: DILATATION AND CURETTAGE;  Surgeon: Benjaman Kindler, MD;  Location: [facility] ORS;  Service: Gynecology;; 04/14/2021: ESOPHAGOGASTRODUODENOSCOPY     Comment:  Dr. Alice Reichert 02/19/2020: HYSTEROSCOPY WITH NOVASURE; N/A     Comment:  Procedure: HYSTEROSCOPY WITH NOVASURE;  Surgeon:               Benjaman Kindler, MD;  Location: [facility] ORS;  Service:               Gynecology;  Laterality: N/A; No date: IUD REMOVAL No date: TUBAL LIGATION  BMI    Body Mass Index: 27.54 kg/m      Reproductive/Obstetrics negative OB ROS                             Anesthesia Physical Anesthesia Plan  ASA: 2  Anesthesia Plan: General   Post-op Pain Management:    Induction: Intravenous  PONV Risk Score and Plan: Propofol infusion and TIVA  Airway Management Planned: Natural Airway and Nasal Cannula  Additional Equipment:   Intra-op Plan:    Post-operative Plan:   Informed Consent: I have reviewed the patients History and Physical, chart, labs and discussed the procedure including the risks, benefits and alternatives for the proposed anesthesia with the patient or authorized representative who has indicated his/her understanding and acceptance.     Dental Advisory Given  Plan Discussed with: Anesthesiologist, CRNA and Surgeon  Anesthesia Plan Comments: (Patient consented for risks of anesthesia including but not limited to:  - adverse reactions to medications - risk of airway placement if required - damage to eyes, teeth, lips or other oral mucosa - nerve damage due to positioning  - sore throat or hoarseness - Damage to heart, brain, nerves, lungs, other parts of body or loss of life  Patient voiced understanding.)       Anesthesia Quick Evaluation

## 2022-11-09 NOTE — Interval H&P Note (Signed)
History and Physical Interval Note:  11/09/2022 8:58 AM  Karen Lowery  has presented today for surgery, with the diagnosis of LUQ ,LLQ PAIN,DIARRHEA,BARRETT'S ESOPHAGUS.  The various methods of treatment have been discussed with the patient and family. After consideration of risks, benefits and other options for treatment, the patient has consented to  Procedure(s): COLONOSCOPY WITH PROPOFOL (N/A) ESOPHAGOGASTRODUODENOSCOPY (EGD) WITH PROPOFOL (N/A) as a surgical intervention.  The patient's history has been reviewed, patient examined, no change in status, stable for surgery.  I have reviewed the patient's chart and labs.  Questions were answered to the patient's satisfaction.     Lesly Rubenstein  Ok to proceed with EGD/Colonoscopy

## 2022-11-09 NOTE — Op Note (Signed)
[facility] Gastroenterology Patient Name: Larimar Pepitone Procedure Date: 11/09/2022 8:51 AM MRN: [id number] Account #: [id number] Date of Birth: [birth year] Admit Type: Outpatient Age: 42 Room: [facility] ENDO ROOM 3 Gender: Female Note Status: Finalized Instrument Name: Upper Endoscope [id number] Procedure:             Upper GI endoscopy Indications:           Dyspepsia Providers:             Andrey Farmer MD, MD Medicines:             Monitored Anesthesia Care Complications:         No immediate complications. Estimated blood loss:                         Minimal. Procedure:             Pre-Anesthesia Assessment:                        - Prior to the procedure, a History and Physical was                         performed, and patient medications and allergies were                         reviewed. The patient is competent. The risks and                         benefits of the procedure and the sedation options and                         risks were discussed with the patient. All questions                         were answered and informed consent was obtained.                         Patient identification and proposed procedure were                         verified by the physician, the nurse, the                         anesthesiologist, the anesthetist and the technician                         in the endoscopy suite. Mental Status Examination:                         alert and oriented. Airway Examination: normal                         oropharyngeal airway and neck mobility. Respiratory                         Examination: clear to auscultation. CV Examination:                         normal. Prophylactic Antibiotics: The patient does not  require prophylactic antibiotics. Prior                         Anticoagulants: The patient has taken no anticoagulant                         or antiplatelet agents. ASA Grade Assessment: II - A                          patient with mild systemic disease. After reviewing                         the risks and benefits, the patient was deemed in                         satisfactory condition to undergo the procedure. The                         anesthesia plan was to use monitored anesthesia care                         (MAC). Immediately prior to administration of                         medications, the patient was re-assessed for adequacy                         to receive sedatives. The heart rate, respiratory                         rate, oxygen saturations, blood pressure, adequacy of                         pulmonary ventilation, and response to care were                         monitored throughout the procedure. The physical                         status of the patient was re-assessed after the                         procedure.                        After obtaining informed consent, the endoscope was                         passed under direct vision. Throughout the procedure,                         the patient's blood pressure, pulse, and oxygen                         saturations were monitored continuously. The Endoscope                         was introduced through the mouth, and advanced to the  second part of duodenum. The upper GI endoscopy was                         accomplished without difficulty. The patient tolerated                         the procedure well. Findings:      The Z-line was variable.      The exam of the esophagus was otherwise normal.      Patchy mild inflammation characterized by erythema was found in the       gastric antrum. Biopsies were taken with a cold forceps for Helicobacter       pylori testing. Estimated blood loss was minimal.      The examined duodenum was normal. Impression:            - Z-line variable.                        - Gastritis. Biopsied.                        - Normal examined  duodenum. Recommendation:        - Discharge patient to home.                        - Resume previous diet.                        - Continue present medications.                        - Await pathology results.                        - Return to referring physician as previously                         scheduled. Procedure Code(s):     --- Professional ---                        [phone], Esophagogastroduodenoscopy, flexible,                         transoral; with biopsy, single or multiple Diagnosis Code(s):     --- Professional ---                        K22.89, Other specified disease of esophagus                        K29.70, Gastritis, unspecified, without bleeding                        R10.13, Epigastric pain CPT copyright 2022 American Medical Association. All rights reserved. The codes documented in this report are preliminary and upon coder review may  be revised to meet current compliance requirements. Andrey Farmer MD, MD 11/09/2022 9:34:52 AM Number of Addenda: 0 Note Initiated On: 11/09/2022 8:51 AM Estimated Blood Loss:  Estimated blood loss was minimal.      [facility]

## 2022-11-09 NOTE — Transfer of Care (Signed)
Immediate Anesthesia Transfer of Care Note  Patient: Karen Lowery  Procedure(s) Performed: COLONOSCOPY WITH PROPOFOL ESOPHAGOGASTRODUODENOSCOPY (EGD) WITH PROPOFOL  Patient Location: Endoscopy Unit  Anesthesia Type:General  Level of Consciousness: drowsy and patient cooperative  Airway & Oxygen Therapy: Patient Spontanous Breathing and Patient connected to face mask oxygen  Post-op Assessment: Report given to RN and Post -op Vital signs reviewed and stable  Post vital signs: Reviewed and stable  Last Vitals:  Vitals Value Taken Time  BP 103/62 11/09/22 0931  Temp 36.4 C 11/09/22 0931  Pulse 97 11/09/22 0932  Resp 18 11/09/22 0932  SpO2 99 % 11/09/22 0932  Vitals shown include unvalidated device data.  Last Pain:  Vitals:   11/09/22 0931  TempSrc: Temporal  PainSc: Asleep         Complications: No notable events documented.

## 2022-11-09 NOTE — H&P (Signed)
Outpatient short stay form Pre-procedure 11/09/2022  Lesly Rubenstein, MD  Primary Physician: Pcp, No  Reason for visit:  Dyspepsia  History of present illness:   42 y/o lady with no significant PMH except mild gastroparesis here for EGD/Colonoscopy for dyspepsia. No blood thinners. No significant abdominal surgeries. No family history of GI malignancies.    Current Facility-Administered Medications:    0.9 %  sodium chloride infusion, , Intravenous, Continuous, Katerina Zurn, Hilton Cork, MD, Last Rate: 20 mL/hr at 11/09/22 0847, New Bag at 11/09/22 0847  Medications Prior to Admission  Medication Sig Dispense Refill Last Dose   brompheniramine-pseudoephedrine-DM 30-2-10 MG/5ML syrup Take 5 mLs by mouth 4 (four) times daily as needed. 120 mL 0    famotidine (PEPCID) 20 MG tablet Take 1 tablet (20 mg total) by mouth daily. 30 tablet 1    sucralfate (CARAFATE) 1 g tablet Take 1 tablet (1 g total) by mouth 4 (four) times daily. 60 tablet 0    valACYclovir (VALTREX) 1000 MG tablet Take 1,000 mg by mouth 2 (two) times daily as needed (arm).        No Known Allergies   Past Medical History:  Diagnosis Date   History of 2019 novel coronavirus disease (COVID-19) 05/2019   Tested + on 05/19/2019    Review of systems:  Otherwise negative.    Physical Exam  Gen: Alert, oriented. Appears stated age.  HEENT: PERRLA. Lungs: No respiratory distress CV: RRR Abd: soft, benign, no masses Ext: No edema    Planned procedures: Proceed with EGD/colonoscopy. The patient understands the nature of the planned procedure, indications, risks, alternatives and potential complications including but not limited to bleeding, infection, perforation, damage to internal organs and possible oversedation/side effects from anesthesia. The patient agrees and gives consent to proceed.  Please refer to procedure notes for findings, recommendations and patient disposition/instructions.     Lesly Rubenstein,  MD [facility]

## 2022-11-09 NOTE — Anesthesia Postprocedure Evaluation (Signed)
Anesthesia Post Note  Patient: Arlynn D Bogusz  Procedure(s) Performed: COLONOSCOPY WITH PROPOFOL ESOPHAGOGASTRODUODENOSCOPY (EGD) WITH PROPOFOL  Patient location during evaluation: Endoscopy Anesthesia Type: General Level of consciousness: awake and alert Pain management: pain level controlled Vital Signs Assessment: post-procedure vital signs reviewed and stable Respiratory status: spontaneous breathing, nonlabored ventilation and respiratory function stable Cardiovascular status: blood pressure returned to baseline and stable Postop Assessment: no apparent nausea or vomiting Anesthetic complications: no   No notable events documented.   Last Vitals:  Vitals:   11/09/22 0941 11/09/22 0951  BP: 108/87 113/77  Pulse: 81 82  Resp: 19 16  Temp:    SpO2: 100% 100%    Last Pain:  Vitals:   11/09/22 0951  TempSrc:   PainSc: 0-No pain                 Alphonsus Sias

## 2022-11-09 NOTE — Anesthesia Procedure Notes (Signed)
Procedure Name: General with mask airway Date/Time: 11/09/2022 9:15 AM  Performed by: Kelton Pillar, CRNAPre-anesthesia Checklist: Patient identified, Emergency Drugs available, Suction available and Patient being monitored Patient Re-evaluated:Patient Re-evaluated prior to induction Oxygen Delivery Method: Simple face mask Induction Type: IV induction Placement Confirmation: positive ETCO2, CO2 detector and breath sounds checked- equal and bilateral Dental Injury: Teeth and Oropharynx as per pre-operative assessment

## 2022-11-09 NOTE — Op Note (Signed)
[facility] Gastroenterology Patient Name: Karen Lowery Procedure Date: 11/09/2022 8:51 AM MRN: [id number] Account #: [id number] Date of Birth: [birth year] Admit Type: Outpatient Age: 42 Room: [facility] ENDO ROOM 3 Gender: Female Note Status: Finalized Instrument Name: Park Meo [id number] Procedure:             Colonoscopy Indications:           Abdominal pain in the left upper quadrant Providers:             Andrey Farmer MD, MD Medicines:             Monitored Anesthesia Care Complications:         No immediate complications. Procedure:             Pre-Anesthesia Assessment:                        - Prior to the procedure, a History and Physical was                         performed, and patient medications and allergies were                         reviewed. The patient is competent. The risks and                         benefits of the procedure and the sedation options and                         risks were discussed with the patient. All questions                         were answered and informed consent was obtained.                         Patient identification and proposed procedure were                         verified by the physician, the nurse, the                         anesthesiologist, the anesthetist and the technician                         in the endoscopy suite. Mental Status Examination:                         alert and oriented. Airway Examination: normal                         oropharyngeal airway and neck mobility. Respiratory                         Examination: clear to auscultation. CV Examination:                         normal. Prophylactic Antibiotics: The patient does not                         require prophylactic antibiotics. Prior  Anticoagulants: The patient has taken no anticoagulant                         or antiplatelet agents. ASA Grade Assessment: II - A                         patient with mild  systemic disease. After reviewing                         the risks and benefits, the patient was deemed in                         satisfactory condition to undergo the procedure. The                         anesthesia plan was to use monitored anesthesia care                         (MAC). Immediately prior to administration of                         medications, the patient was re-assessed for adequacy                         to receive sedatives. The heart rate, respiratory                         rate, oxygen saturations, blood pressure, adequacy of                         pulmonary ventilation, and response to care were                         monitored throughout the procedure. The physical                         status of the patient was re-assessed after the                         procedure.                        After obtaining informed consent, the colonoscope was                         passed under direct vision. Throughout the procedure,                         the patient's blood pressure, pulse, and oxygen                         saturations were monitored continuously. The                         Colonoscope was introduced through the anus and                         advanced to the the terminal ileum. The colonoscopy  was somewhat difficult due to a redundant colon. The                         patient tolerated the procedure well. The quality of                         the bowel preparation was good. The terminal ileum,                         ileocecal valve, appendiceal orifice, and rectum were                         photographed. Findings:      The perianal and digital rectal examinations were normal.      The terminal ileum appeared normal.      A single small-mouthed diverticulum was found in the sigmoid colon.      Internal hemorrhoids were found during retroflexion. The hemorrhoids       were Grade I (internal hemorrhoids that do not  prolapse).      The exam was otherwise without abnormality on direct and retroflexion       views. Impression:            - The examined portion of the ileum was normal.                        - Diverticulosis in the sigmoid colon.                        - Internal hemorrhoids.                        - The examination was otherwise normal on direct and                         retroflexion views.                        - No specimens collected. Recommendation:        - Discharge patient to home.                        - Resume previous diet.                        - Continue present medications.                        - Repeat colonoscopy in 10 years for screening                         purposes.                        - Return to referring physician as previously                         scheduled. Procedure Code(s):     --- Professional ---                        [phone], Colonoscopy, flexible; diagnostic, including  collection of specimen(s) by brushing or washing, when                         performed (separate procedure) Diagnosis Code(s):     --- Professional ---                        K64.0, First degree hemorrhoids                        R10.12, Left upper quadrant pain                        K57.30, Diverticulosis of large intestine without                         perforation or abscess without bleeding CPT copyright 2022 American Medical Association. All rights reserved. The codes documented in this report are preliminary and upon coder review may  be revised to meet current compliance requirements. Andrey Farmer MD, MD 11/09/2022 9:40:17 AM Number of Addenda: 0 Note Initiated On: 11/09/2022 8:51 AM Scope Withdrawal Time: 0 hours 9 minutes 31 seconds  Total Procedure Duration: 0 hours 15 minutes 43 seconds  Estimated Blood Loss:  Estimated blood loss: none.      [facility]

## 2022-11-12 ENCOUNTER — Encounter: Payer: Self-pay | Admitting: Gastroenterology

## 2022-11-12 LAB — SURGICAL PATHOLOGY

## 2023-03-25 DIAGNOSIS — K580 Irritable bowel syndrome with diarrhea: Secondary | ICD-10-CM | POA: Diagnosis not present

## 2023-03-25 DIAGNOSIS — K30 Functional dyspepsia: Secondary | ICD-10-CM | POA: Diagnosis not present

## 2023-03-25 DIAGNOSIS — K219 Gastro-esophageal reflux disease without esophagitis: Secondary | ICD-10-CM | POA: Diagnosis not present

## 2023-04-26 DIAGNOSIS — H6063 Unspecified chronic otitis externa, bilateral: Secondary | ICD-10-CM | POA: Diagnosis not present

## 2023-07-02 DIAGNOSIS — Z0000 Encounter for general adult medical examination without abnormal findings: Secondary | ICD-10-CM | POA: Diagnosis not present

## 2023-07-02 DIAGNOSIS — K580 Irritable bowel syndrome with diarrhea: Secondary | ICD-10-CM | POA: Diagnosis not present

## 2023-07-02 DIAGNOSIS — E538 Deficiency of other specified B group vitamins: Secondary | ICD-10-CM | POA: Diagnosis not present

## 2023-07-02 DIAGNOSIS — Z91018 Allergy to other foods: Secondary | ICD-10-CM | POA: Diagnosis not present

## 2023-07-02 DIAGNOSIS — Z1231 Encounter for screening mammogram for malignant neoplasm of breast: Secondary | ICD-10-CM | POA: Diagnosis not present

## 2023-07-04 ENCOUNTER — Other Ambulatory Visit: Payer: Self-pay | Admitting: Internal Medicine

## 2023-07-04 DIAGNOSIS — Z1231 Encounter for screening mammogram for malignant neoplasm of breast: Secondary | ICD-10-CM

## 2024-07-07 DIAGNOSIS — Z0000 Encounter for general adult medical examination without abnormal findings: Secondary | ICD-10-CM | POA: Diagnosis not present

## 2024-07-07 DIAGNOSIS — E538 Deficiency of other specified B group vitamins: Secondary | ICD-10-CM | POA: Diagnosis not present

## 2024-07-14 DIAGNOSIS — K2900 Acute gastritis without bleeding: Secondary | ICD-10-CM | POA: Diagnosis not present

## 2024-07-14 DIAGNOSIS — Z0000 Encounter for general adult medical examination without abnormal findings: Secondary | ICD-10-CM | POA: Diagnosis not present

## 2024-07-14 DIAGNOSIS — Z1331 Encounter for screening for depression: Secondary | ICD-10-CM | POA: Diagnosis not present

## 2024-07-14 DIAGNOSIS — E538 Deficiency of other specified B group vitamins: Secondary | ICD-10-CM | POA: Diagnosis not present

## 2024-07-14 DIAGNOSIS — K580 Irritable bowel syndrome with diarrhea: Secondary | ICD-10-CM | POA: Diagnosis not present
